# Patient Record
Sex: Female | Born: 1940 | Race: White | Hispanic: No | Marital: Married | Smoking: Former smoker
Health system: Southern US, Community
[De-identification: ages and names within clinical notes are randomized; demographics above are authoritative.]

## PROBLEM LIST (undated history)

## (undated) DIAGNOSIS — R21 Rash and other nonspecific skin eruption: Secondary | ICD-10-CM

## (undated) DIAGNOSIS — R32 Unspecified urinary incontinence: Secondary | ICD-10-CM

## (undated) DIAGNOSIS — Z98811 Dental restoration status: Secondary | ICD-10-CM

## (undated) DIAGNOSIS — K219 Gastro-esophageal reflux disease without esophagitis: Secondary | ICD-10-CM

## (undated) DIAGNOSIS — F32A Depression, unspecified: Secondary | ICD-10-CM

## (undated) DIAGNOSIS — Z8679 Personal history of other diseases of the circulatory system: Secondary | ICD-10-CM

## (undated) DIAGNOSIS — C50919 Malignant neoplasm of unspecified site of unspecified female breast: Secondary | ICD-10-CM

## (undated) DIAGNOSIS — F329 Major depressive disorder, single episode, unspecified: Secondary | ICD-10-CM

## (undated) DIAGNOSIS — K589 Irritable bowel syndrome without diarrhea: Secondary | ICD-10-CM

## (undated) DIAGNOSIS — M1990 Unspecified osteoarthritis, unspecified site: Secondary | ICD-10-CM

## (undated) DIAGNOSIS — J302 Other seasonal allergic rhinitis: Secondary | ICD-10-CM

## (undated) HISTORY — DX: Major depressive disorder, single episode, unspecified: F32.9

## (undated) HISTORY — DX: Malignant neoplasm of unspecified site of unspecified female breast: C50.919

## (undated) HISTORY — PX: TONSILLECTOMY: SUR1361

## (undated) HISTORY — PX: CARDIOVERSION: SHX1299

## (undated) HISTORY — PX: BLADDER SURGERY: SHX569

## (undated) HISTORY — DX: Unspecified osteoarthritis, unspecified site: M19.90

## (undated) HISTORY — PX: ABDOMINAL HYSTERECTOMY: SHX81

## (undated) HISTORY — DX: Irritable bowel syndrome, unspecified: K58.9

## (undated) HISTORY — DX: Depression, unspecified: F32.A

## (undated) HISTORY — PX: APPENDECTOMY: SHX54

## (undated) HISTORY — PX: TENNIS ELBOW RELEASE/NIRSCHEL PROCEDURE: SHX6651

## (undated) HISTORY — PX: CATARACT EXTRACTION W/ INTRAOCULAR LENS IMPLANT: SHX1309

---

## 2000-01-30 ENCOUNTER — Other Ambulatory Visit: Admission: RE | Admit: 2000-01-30 | Discharge: 2000-01-30 | Payer: Self-pay | Admitting: *Deleted

## 2000-04-20 ENCOUNTER — Ambulatory Visit (HOSPITAL_COMMUNITY): Admission: RE | Admit: 2000-04-20 | Discharge: 2000-04-20 | Payer: Self-pay | Admitting: Ophthalmology

## 2000-12-30 ENCOUNTER — Other Ambulatory Visit: Admission: RE | Admit: 2000-12-30 | Discharge: 2000-12-30 | Payer: Self-pay | Admitting: *Deleted

## 2002-11-09 ENCOUNTER — Encounter: Payer: Self-pay | Admitting: Orthopaedic Surgery

## 2002-11-09 ENCOUNTER — Encounter: Admission: RE | Admit: 2002-11-09 | Discharge: 2002-11-09 | Payer: Self-pay | Admitting: Orthopaedic Surgery

## 2002-12-21 ENCOUNTER — Encounter: Payer: Self-pay | Admitting: Neurological Surgery

## 2002-12-21 ENCOUNTER — Ambulatory Visit (HOSPITAL_COMMUNITY): Admission: RE | Admit: 2002-12-21 | Discharge: 2002-12-23 | Payer: Self-pay | Admitting: Neurological Surgery

## 2002-12-21 HISTORY — PX: LAMINECTOMY AND MICRODISCECTOMY LUMBAR SPINE: SHX1913

## 2003-02-02 ENCOUNTER — Other Ambulatory Visit: Admission: RE | Admit: 2003-02-02 | Discharge: 2003-02-02 | Payer: Self-pay | Admitting: Gynecology

## 2003-11-27 ENCOUNTER — Other Ambulatory Visit: Admission: RE | Admit: 2003-11-27 | Discharge: 2003-11-27 | Payer: Self-pay | Admitting: Gynecology

## 2003-12-07 ENCOUNTER — Ambulatory Visit (HOSPITAL_COMMUNITY): Admission: RE | Admit: 2003-12-07 | Discharge: 2003-12-07 | Payer: Self-pay | Admitting: Cardiology

## 2004-10-16 ENCOUNTER — Ambulatory Visit: Payer: Self-pay | Admitting: Internal Medicine

## 2004-12-01 ENCOUNTER — Other Ambulatory Visit: Admission: RE | Admit: 2004-12-01 | Discharge: 2004-12-01 | Payer: Self-pay | Admitting: Gynecology

## 2005-04-23 ENCOUNTER — Ambulatory Visit: Payer: Self-pay | Admitting: Internal Medicine

## 2005-09-23 ENCOUNTER — Ambulatory Visit: Payer: Self-pay | Admitting: Internal Medicine

## 2005-11-30 ENCOUNTER — Ambulatory Visit: Payer: Self-pay | Admitting: Internal Medicine

## 2005-12-16 ENCOUNTER — Other Ambulatory Visit: Admission: RE | Admit: 2005-12-16 | Discharge: 2005-12-16 | Payer: Self-pay | Admitting: Gynecology

## 2005-12-17 ENCOUNTER — Ambulatory Visit: Payer: Self-pay | Admitting: Internal Medicine

## 2006-06-11 ENCOUNTER — Ambulatory Visit: Payer: Self-pay | Admitting: Internal Medicine

## 2006-11-26 ENCOUNTER — Ambulatory Visit: Payer: Self-pay | Admitting: Internal Medicine

## 2006-12-30 ENCOUNTER — Ambulatory Visit: Payer: Self-pay | Admitting: Internal Medicine

## 2007-01-20 ENCOUNTER — Other Ambulatory Visit: Admission: RE | Admit: 2007-01-20 | Discharge: 2007-01-20 | Payer: Self-pay | Admitting: Gynecology

## 2007-07-07 ENCOUNTER — Ambulatory Visit: Payer: Self-pay | Admitting: Internal Medicine

## 2009-03-20 ENCOUNTER — Encounter: Admission: RE | Admit: 2009-03-20 | Discharge: 2009-03-20 | Payer: Self-pay | Admitting: Neurological Surgery

## 2009-12-18 ENCOUNTER — Encounter: Admission: RE | Admit: 2009-12-18 | Discharge: 2009-12-18 | Payer: Self-pay | Admitting: Neurological Surgery

## 2011-02-27 NOTE — Op Note (Signed)
NAME:  Kathryn Sanchez, Kathryn Sanchez                        ACCOUNT NO.:  [id number]   MEDICAL RECORD NO.:  [id number]                   PATIENT TYPE:  OIB   LOCATION:  3172                                 FACILITY:  [facility]   PHYSICIAN:  Stefani Dama, M.D.               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   PROCEDURE:  Right L4-5 laminotomy, foraminotomy, with microdissection  technique and operating microscope.   SURGEON:  Stefani Dama, M.D.   ASSISTANT:  Hilda Lias, M.D.   ANESTHESIA:  General endotracheal.   INDICATIONS:  The patient is a 70 year old individual who has had  significant back and right lower extremity pain with some weakness in the  tibialis anterior group.  She has a large synovial cyst on the right side at  the L4-5 level.  She is advised regarding surgical decompression of this  lesion.  She is taken to the operating room.   DESCRIPTION OF PROCEDURE:  The patient was brought to the operating room  supine on the stretcher.  After the smooth induction of general endotracheal  anesthesia, she was turned prone and the back was shaved, prepped with  Duraprep, and draped in a sterile fashion.  A small incision was made in the  midportion of the lumbar spine and carried down to the lumbar dorsal fascia.  A localizing radiograph identified this as the 4-5 space positively and then  a subperiosteal dissection was performed in this area to expose the inferior  margin of the lamina of L4 out to the mesial facet joint.  A self-retaining  retractor was placed in this wound.  Then with the operating microscope  being draped and brought into the field, laminotomy was created removing the  inferior margin of the lamina of L4  out to the mesial wall of the facet.  The yellow ligament was taken up, and underneath the yellow ligament there  was noted to be a significant cystic structure, which had a tough, fibrous  capsule that was adherent to the dura.  Care was taken to resect this in a  piecemeal fashion, and the laminotomy was extended cephalad to expose the  entire extent of the cyst itself.  As the resection proceeded, the scar  adherent to the dural tube was teased off and resected from the dura.  This  was done carefully so as not to perforate the dura itself.  The L5 nerve  root was decompressed in this fashion.  Ultimately the entirety of the cyst  was resected using a microdissection technique.  Dr. Jeral Fruit helped during  this portion of the operation, providing retraction and protection of the  dura as the resection  continued.  In the end the common dural tube was  completely free of any scar tissue and attachments, and the foramen of the  L4 nerve root was noted to be free and clear.  Hemostasis in the soft  tissues was obtained, and then the lumbar dorsal fascia was reapproximated  with #1 Vicryl in interrupted fashion, 2-0 Vicryl was used subcutaneously, 3-  0 Vicryl was used subcuticularly.  The patient tolerated the procedure well.                                               Stefani Dama, M.D.    [name]  D:  12/21/2002  T:  12/21/2002  Job:  [id number]

## 2011-02-27 NOTE — Discharge Summary (Signed)
   NAME:  Kathryn Sanchez, Kathryn Sanchez                        ACCOUNT NO.:  [id number]   MEDICAL RECORD NO.:  [id number]                   PATIENT TYPE:  OIB   LOCATION:  3030                                 FACILITY:  [facility]   PHYSICIAN:  Stefani Dama, M.D.               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   OPERATION:  L3-4 laminotomy, foraminotomy, with microdissection technique.  Decompression of L4 and L5 nerve roots.   HOSPITAL COURSE:  Kathryn Sanchez is a 69 year old individual who has had  significant and quite severe back and right lower extremity pain.  She has a  synovial cyst on the right side, and evaluation of this as an outpatient  indicated that she should have surgical decompression.  This was performed  on 12/21/02.  Postoperatively, the patient complained of significant quite  severe back pain and spasms.  She was given some anti-inflammatory in the  form of IV Toradol, and this seemed to resolve the situation; however, the  patient required an addition night's stay as her level of pain on the end of  the first postoperative day was such that she could not ambulate  independently.  At the time of discharge, she was given a prescription for  Celebrex 200 mg twice a day in addition to Percocet, #40 without refills,  Valium, #30 without refills.   CONDITION ON DISCHARGE:  Improving.                                               Stefani Dama, M.D.    [name]  D:  12/22/2002  T:  12/24/2002  Job:  [id number]

## 2011-02-27 NOTE — Assessment & Plan Note (Signed)
[facility]                             PULMONARY OFFICE NOTE   Kathryn Sanchez, Kathryn Sanchez                     MRN:          [id number]  DATE:11/26/2006                            DOB:          09/06/1941    PROBLEMS:  Allergic rhinitis.   HISTORY:  She returns for one year follow up feeling quite stable. She  would like a prescription for Zithromax to hold, used twice last year.  We discussed resistance and sensitization issues. She continues allergy  vaccine at 1-10 with no problems. Injections are given by her husband.  She lives at [facility]. We discussed environmental triggers.  We discussed allergy vaccine safety, including a discussion of issues  related to administration outside of a medical office, anaphylaxis, and  epinephrine. I gave a refill EpiPen prescription.   MEDICATIONS:  1. Paxil 15 mg.  2. BuSpar 15 mg.  3. Estrogen patch.  4. Allergy vaccine.  5. Wellbutrin 300 mg.  6. Lipitor 10 mg.  7. Vesicare.  8. Restasis.  9. P.r.n. use of Z-pack and EpiPen.  Drug intolerant of PENICILLIN, KEFLEX, DEMEROL, and SULFA.   OBJECTIVE:  Weight 147 pounds, blood pressure 114/70, pulse 72, room air  saturation 98%. Nose and chest are clear. She seems quite comfortable.  Pharynx is normal. No adenopathy. Heart sounds normal.   IMPRESSION:  Allergic rhinitis, well controlled.   PLAN:  1. Continue vaccine at 1-10.  2. Refill EpiPen with discussion as above.  3. Refillable Z-pack with discussion.     Clinton D. Maple Hudson, MD, Tonny Bollman, FACP  Electronically Signed    CDY/MedQ  DD: 11/26/2006  DT: 11/27/2006  Job #: [id number]   cc:   Duwayne Heck L. Mahaffey, M.D.

## 2015-04-12 DIAGNOSIS — C50919 Malignant neoplasm of unspecified site of unspecified female breast: Secondary | ICD-10-CM

## 2015-04-12 HISTORY — DX: Malignant neoplasm of unspecified site of unspecified female breast: C50.919

## 2015-04-26 ENCOUNTER — Telehealth: Payer: Self-pay | Admitting: *Deleted

## 2015-04-26 NOTE — Telephone Encounter (Signed)
Confirmed [facility] for 05/01/15 at 1230 .  Instructions and contact information given.

## 2015-04-30 ENCOUNTER — Other Ambulatory Visit: Payer: Self-pay | Admitting: *Deleted

## 2015-04-30 DIAGNOSIS — C50511 Malignant neoplasm of lower-outer quadrant of right female breast: Secondary | ICD-10-CM | POA: Insufficient documentation

## 2015-05-01 ENCOUNTER — Encounter: Payer: Self-pay | Admitting: *Deleted

## 2015-05-01 ENCOUNTER — Encounter: Payer: Self-pay | Admitting: Adult Health

## 2015-05-01 ENCOUNTER — Ambulatory Visit: Payer: Medicare Other

## 2015-05-01 ENCOUNTER — Other Ambulatory Visit: Payer: Self-pay | Admitting: General Surgery

## 2015-05-01 ENCOUNTER — Other Ambulatory Visit (HOSPITAL_BASED_OUTPATIENT_CLINIC_OR_DEPARTMENT_OTHER): Payer: Medicare Other

## 2015-05-01 ENCOUNTER — Encounter (INDEPENDENT_AMBULATORY_CARE_PROVIDER_SITE_OTHER): Payer: Self-pay

## 2015-05-01 ENCOUNTER — Ambulatory Visit: Payer: Medicare Other | Attending: General Surgery | Admitting: Physical Therapy

## 2015-05-01 ENCOUNTER — Ambulatory Visit (HOSPITAL_BASED_OUTPATIENT_CLINIC_OR_DEPARTMENT_OTHER): Payer: Medicare Other | Admitting: Hematology and Oncology

## 2015-05-01 ENCOUNTER — Encounter: Payer: Self-pay | Admitting: Hematology and Oncology

## 2015-05-01 ENCOUNTER — Ambulatory Visit
Admission: RE | Admit: 2015-05-01 | Discharge: 2015-05-01 | Disposition: A | Discharge status: Home / Self Care | Payer: Medicare Other | Source: Ambulatory Visit | Attending: Radiation Oncology | Admitting: Radiation Oncology

## 2015-05-01 VITALS — BP 143/62 | HR 84 | Temp 98.0°F | Resp 18 | Ht 62.0 in | Wt 145.7 lb

## 2015-05-01 DIAGNOSIS — R293 Abnormal posture: Secondary | ICD-10-CM | POA: Insufficient documentation

## 2015-05-01 DIAGNOSIS — C50511 Malignant neoplasm of lower-outer quadrant of right female breast: Secondary | ICD-10-CM

## 2015-05-01 DIAGNOSIS — Z170 Estrogen receptor positive status [ER+]: Secondary | ICD-10-CM

## 2015-05-01 LAB — COMPREHENSIVE METABOLIC PANEL (CC13)
ALT: 29 U/L (ref 0–55)
ANION GAP: 10 meq/L (ref 3–11)
AST: 31 U/L (ref 5–34)
Albumin: 3.9 g/dL (ref 3.5–5.0)
Alkaline Phosphatase: 95 U/L (ref 40–150)
BUN: 16.1 mg/dL (ref 7.0–26.0)
CO2: 25 mEq/L (ref 22–29)
Calcium: 9.4 mg/dL (ref 8.4–10.4)
Chloride: 105 mEq/L (ref 98–109)
Creatinine: 1 mg/dL (ref 0.6–1.1)
EGFR: 58 mL/min/{1.73_m2} — ABNORMAL LOW (ref >90)
Glucose: 126 mg/dl (ref 70–140)
Potassium: 3.9 mEq/L (ref 3.5–5.1)
SODIUM: 140 meq/L (ref 136–145)
Total Bilirubin: 0.45 mg/dL (ref 0.20–1.20)
Total Protein: 7.1 g/dL (ref 6.4–8.3)

## 2015-05-01 LAB — CBC WITH DIFFERENTIAL/PLATELET
BASO%: 1.1 % (ref 0.0–2.0)
BASOS ABS: 0.1 10*3/uL (ref 0.0–0.1)
EOS%: 1.6 % (ref 0.0–7.0)
Eosinophils Absolute: 0.1 10*3/uL (ref 0.0–0.5)
HEMATOCRIT: 42.3 % (ref 34.8–46.6)
HGB: 13.9 g/dL (ref 11.6–15.9)
LYMPH%: 33.8 % (ref 14.0–49.7)
MCH: 32 pg (ref 25.1–34.0)
MCHC: 32.9 g/dL (ref 31.5–36.0)
MCV: 97.5 fL (ref 79.5–101.0)
MONO#: 0.5 10*3/uL (ref 0.1–0.9)
MONO%: 8.2 % (ref 0.0–14.0)
NEUT%: 55.3 % (ref 38.4–76.8)
NEUTROS ABS: 3.1 10*3/uL (ref 1.5–6.5)
PLATELETS: 254 10*3/uL (ref 145–400)
RBC: 4.34 10*6/uL (ref 3.70–5.45)
RDW: 14.2 % (ref 11.2–14.5)
WBC: 5.6 10*3/uL (ref 3.9–10.3)
lymph#: 1.9 10*3/uL (ref 0.9–3.3)

## 2015-05-01 NOTE — Progress Notes (Signed)
[place] NOTE  Patient Care Team: Pcp Not In System as PCP - Staunton III, MD as Consulting Physician (General Surgery) Nicholas Lose, MD as Consulting Physician (Hematology and Oncology) Gery Pray, MD as Consulting Physician (Radiation Oncology) Mauro Kaufmann, RN as Registered Nurse Rockwell Germany, RN as Registered Nurse Holley Bouche, NP as Nurse Practitioner (Nurse Practitioner)  CHIEF COMPLAINTS/PURPOSE OF CONSULTATION:  Newly diagnosed breast cancer  HISTORY OF PRESENTING ILLNESS:  Kathryn Sanchez 74 y.o. female is here because of recent diagnosis of right breast cancer. She had a routine screening mammogram detected a right breast mass that measures 7 mm at 6:00 position. This was further evaluated by ultrasound and ultrasound-guided biopsy. 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 Axilla was normal by ultrasound. She was presented this morning at the multidisciplinary tumor board and she is here. The [facility] clinic discussed the treatment plan.  I reviewed her records extensively and collaborated the history with the patient.  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  She was never exposed to fertility medications or  hormone replacement therapy.  She has no family history of Breast/GYN/GI cancer  MEDICAL HISTORY:  Past Medical History  Diagnosis Date  . Breast cancer   . Depression   . Atrial fibrillation   . IBS (irritable bowel syndrome)   . Incontinence   . Arthritis     SURGICAL HISTORY: Past Surgical History  Procedure Laterality Date  . Tonsillectomy    . Tennis elbow release/nirschel procedure    . Abdominal hysterectomy    . Appendectomy      SOCIAL HISTORY: History   Social History  . Marital Status: Married    Spouse Name: N/A  . Number of Children: N/A  . Years of Education: N/A   Occupational History  . Not on file.   Social History Main Topics  . Smoking status: Former Research scientist (life sciences)  . Smokeless tobacco: Not on file  . Alcohol Use: Yes  . Drug Use: No  . Sexual Activity: Not on file   Other Topics Concern  . Not on file   Social History Narrative  . No narrative on file    FAMILY HISTORY: History reviewed. No pertinent family history.  ALLERGIES:  is allergic to cephalexin; clarithromycin; flexeril; morphine; sulfadiazine; ciprofloxacin; metronidazole; and penicillins.  MEDICATIONS:  Current Outpatient Prescriptions  Medication Sig Dispense Refill  . buPROPion (WELLBUTRIN XL) 300 MG 24 hr tablet 1 po  daily    . busPIRone (BUSPAR) 15 MG tablet 1 by mouth daily    . diphenhydrAMINE (SOMINEX) 25 MG tablet Take 25 mg by mouth every 6 (six) hours as needed for allergies.    Marland Kitchen estradiol (CLIMARA - DOSED IN  MG/24 HR) 0.1 mg/24hr patch     . omeprazole (PRILOSEC) 20 MG capsule Take by mouth.    Marland Kitchen PARoxetine (PAXIL) 10 MG tablet 1 by mouth daily    . solifenacin (VESICARE) 10 MG tablet     . traMADol (ULTRAM) 50 MG tablet     . triamcinolone cream (KENALOG) 0.5 % Apply 1 application topically 2 (two) times daily.    . Vitamin D, Ergocalciferol, (DRISDOL) 50000 UNITS CAPS capsule Take 50,000 Units by mouth every 7 (seven) days.    Marland Kitchen warfarin (COUMADIN) 5 MG tablet       No current facility-administered medications for this visit.    REVIEW OF SYSTEMS:   Constitutional: Denies fevers, chills or abnormal night sweats Eyes: Denies blurriness of vision, double vision or watery eyes Ears, nose, mouth, throat, and face: Denies mucositis or sore throat Respiratory: Denies cough, dyspnea or wheezes Cardiovascular: Denies palpitation, chest discomfort or lower extremity swelling Gastrointestinal:  Denies nausea, heartburn or change in bowel habits Skin: Denies abnormal skin rashes Lymphatics: Denies new lymphadenopathy or easy bruising Neurological:Denies numbness, tingling or new weaknesses Behavioral/Psych: Mood is stable, no new changes  Breast:  Denies any palpable lumps or discharge All other systems were reviewed with the patient and are negative.  PHYSICAL EXAMINATION: ECOG PERFORMANCE STATUS: 1 - Symptomatic but completely ambulatory  Filed Vitals:   05/01/15 1302  BP: 143/62  Pulse: 84  Temp: 98 F (36.7 C)  Resp: 18   Filed Weights   05/01/15 1302  Weight: 145 lb 11.2 oz (66.089 kg)    GENERAL:alert, no distress and comfortable SKIN: skin color, texture, turgor are normal, no rashes or significant lesions EYES: normal, conjunctiva are pink and non-injected, sclera clear OROPHARYNX:no exudate, no erythema and lips, buccal mucosa, and tongue normal  NECK: supple, thyroid normal size, non-tender, without nodularity LYMPH:  no palpable lymphadenopathy in the cervical, axillary or inguinal LUNGS: clear to auscultation and percussion with normal breathing effort HEART: regular rate & rhythm and no murmurs and no lower extremity edema ABDOMEN:abdomen soft, non-tender and normal bowel sounds Musculoskeletal:no cyanosis of digits and no clubbing  PSYCH: alert & oriented x 3 with fluent speech NEURO: no focal motor/sensory deficits BREAST: No palpable nodules in breast. Bruising from recent biopsy. No palpable axillary or supraclavicular  lymphadenopathy (exam performed in the presence of a chaperone)   LABORATORY DATA:  I have reviewed the data as listed Lab Results  Component Value Date   WBC 5.6 05/01/2015   HGB 13.9 05/01/2015   HCT 42.3 05/01/2015   MCV 97.5 05/01/2015   PLT 254 05/01/2015   Lab Results  Component Value Date   NA 140 05/01/2015   K 3.9 05/01/2015   CO2 25 05/01/2015    RADIOGRAPHIC STUDIES: I have personally reviewed the radiological reports and agreed with the findings in the report. 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 7 mm by ultrasound T1b N0 M0 stage IA clinical stage  Pathology and radiology counseling: Discussed with the patient, the details of pathology including the type of breast cancer,the clinical staging, the significance of ER, PR and HER-2/neu receptors and the implications for treatment. After reviewing the pathology in detail, we proceeded to discuss the different treatment options between surgery, radiation, antiestrogen  therapies.  Recommendation: 1. Breast conserving surgery followed by 2. Adjuvant radiation followed by 3. Antiestrogen therapy with anastrozole 1 mg daily 5 years bone density test done on 04/24/2015 showed T score of -1 normal bone density  Aromatase number or counseling:We discussed the risks and benefits of anti-estrogen therapy with aromatase inhibitors. These include but not limited to insomnia, hot flashes, mood changes, vaginal dryness, bone density loss, and weight gain. Although rare, serious side effects including endometrial cancer, risk of blood clots were also discussed. We strongly believe that the benefits far outweigh the risks. Patient understands these risks and consented to  starting treatment. Planned treatment duration is 5 years.  Return to clinic after surgery to discuss adjuvant treatment plan.    All questions were answered. The patient knows to call the clinic with any problems, questions or concerns.    Rulon Eisenmenger, MD 3:21 PM

## 2015-05-01 NOTE — Progress Notes (Signed)
Note created during office visit. Copy to patient, original to scan.

## 2015-05-01 NOTE — Progress Notes (Signed)
[place] Psychosocial Distress Screening Clinical Social Work  Clinical Social Work was referred by Engineer, site to meet with pt, as pt checked issues with depression, hallucinations and suicidal thoughts. CSW also met with pt to introduce self, explain role of Pt and Family Support Team and review distress screening protocol.  The patient scored a 3 on the Psychosocial Distress Thermometer which indicates no distress. Clinical Social Worker met with pt and her husband to assess for distress and other psychosocial needs. Pt reports she had h/o depression and SI that occurred in 1996. She denies current concerns with these issues and has been well managed on her medications. CSW inquired about current coping with new cancer diagnosis and she reports to be doing pretty well. She is eager to get her surgery complete and "get things sorted out". She feels well support by her husband who is with her today, he was also aware of her mental health needs. She feels her grand children cause her the most stress, as they are in college and "making some poor choices". Pt is aware of a local breast cancer support group in her local area and she plans to attend. CSW reviewed other options for support through the [facility] and Pt and Family Support Team. CSW encouraged pt to reach out and notify team/medical provider if she starts feeling more depressed or has SI. She stated understanding.   ONCBCN DISTRESS SCREENING 05/01/2015  Screening Type Initial Screening  Distress experienced in past week (1-10) 3  Family Problem type Other (comment);Children  Physical Problem type Constipation/diarrhea;Other (comment)  Physician notified of physical symptoms Yes  Referral to clinical social work Yes     Clinical Social Worker follow up needed: No.  If yes, follow up plan: See above Loren Racer, [place]  [facility] Phone: [phone] Fax: [phone]

## 2015-05-01 NOTE — Progress Notes (Signed)
Received bone density report from [facility], sent to scan. 

## 2015-05-01 NOTE — Progress Notes (Signed)
Radiation Oncology         [phone] ________________________________  Initial Outpatient Consultation  Name: Kathryn Sanchez MRN: [id number]  Date: 05/01/2015  DOB: 1941/06/04  CC:Pcp Not In [place], [place], MD   REFERRING PHYSICIAN: Autumn Messing III, MD  DIAGNOSIS: The encounter diagnosis was Breast cancer of lower-outer quadrant of right female breast. Grade I to II Invasive Ductal Carcinoma   HISTORY OF PRESENT ILLNESS::Kathryn Sanchez is a 74 y.o. female who is here at the courtesy of Dr. Marlou Starks because of recent diagnosis of right breast cancer.  She had a routine screening mammogram detected a right breast mass that measures 7 mm at 6:00 position. This was further evaluated by ultrasound and ultrasound-guided biopsy. 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 Axilla was normal by ultrasound. She was presented this morning at the multidisciplinary tumor board and she is here. The [facility] clinic discussed the treatment plan.  She is interested in breast conservation therapy.  PREVIOUS RADIATION THERAPY: No  PAST MEDICAL HISTORY:  has a past medical history of Breast cancer; Depression; Atrial fibrillation; IBS (irritable bowel syndrome); Incontinence; and Arthritis.    PAST SURGICAL HISTORY: Past Surgical History  Procedure Laterality Date  . Tonsillectomy    . Tennis elbow release/nirschel procedure    . Abdominal hysterectomy    . Appendectomy      FAMILY HISTORY: family history is not on file.  SOCIAL HISTORY:  reports that she has quit smoking. She does not have any smokeless tobacco history on file. She reports that she drinks alcohol. She reports that she does not use illicit drugs.  ALLERGIES: Cephalexin; Clarithromycin; Flexeril; Morphine; Sulfadiazine; Ciprofloxacin; Metronidazole; and Penicillins  MEDICATIONS:  Current Outpatient Prescriptions  Medication Sig Dispense Refill  .  buPROPion (WELLBUTRIN XL) 300 MG 24 hr tablet 1 po  daily    . busPIRone (BUSPAR) 15 MG tablet 1 by mouth daily    . diphenhydrAMINE (SOMINEX) 25 MG tablet Take 25 mg by mouth every 6 (six) hours as needed for allergies.    Marland Kitchen estradiol (CLIMARA - DOSED IN MG/24 HR) 0.1 mg/24hr patch     . omeprazole (PRILOSEC) 20 MG capsule Take by mouth.    Marland Kitchen PARoxetine (PAXIL) 10 MG tablet 1 by mouth daily    . solifenacin (VESICARE) 10 MG tablet     . traMADol (ULTRAM) 50 MG tablet     . triamcinolone cream (KENALOG) 0.5 % Apply 1 application topically 2 (two) times daily.    . Vitamin D, Ergocalciferol, (DRISDOL) 50000 UNITS CAPS capsule Take 50,000 Units by mouth every 7 (seven) days.    Marland Kitchen warfarin (COUMADIN) 5 MG tablet      No current facility-administered medications for this encounter.    REVIEW OF SYSTEMS:  A 15 point review of systems is documented in the electronic medical record. This was obtained by the nursing staff. However, I reviewed this with the patient to discuss relevant findings and make appropriate changes.  Breast: Denies any palpable lumps or discharge   Gynecologic History  Age at first menstrual period? 13  Are you still having periods? No Approximate date of last period? 1987  If you no longer have periods: Have you used hormone replacement? Yes  If YES, for how long? Still using Obstetric History:  How many children have you carried to term? 2 Your age at first live birth? [place]  Pregnant now or trying to get pregnant? No  Have you used birth control pills or hormone shots for contraception? Yes  If so, for how long (or approximate dates)? Pills for 10 years. IUD for 2 years.  Would you be interested in learning more about he options to preserve fertility? No\  Health Maintenance:  Have you ever had a colonoscopy? Yes (Several). Refer to Dr. Teena Irani.  Have you ever had a bone density? Yes If yes, date? Last week.  Date of your last PAP smear? 2015 Date of your FIRST  mammogram? 74 years old   PHYSICAL EXAM:  Vitals - 1 value per visit 8/40/3754  SYSTOLIC 360  DIASTOLIC 62  Pulse 84  Temperature 98  Respirations 18  Weight (lb) 145.7  Height 5' 2"   BMI 26.64  VISIT REPORT    Lungs are clear. Heart has regular rate and rhythm. No palpable cervical, supraclavicular, or axillary adenopathy. No palpable mass or nipple discharge in the left breast. The right breast has no palpable mass or nipple discharge on examination. Bruising in the inferior aspect of the breast from  recent biopsy is noted.  ECOG = 1  LABORATORY DATA:  Lab Results  Component Value Date   WBC 5.6 05/01/2015   HGB 13.9 05/01/2015   HCT 42.3 05/01/2015   MCV 97.5 05/01/2015   PLT 254 05/01/2015   NEUTROABS 3.1 05/01/2015   Lab Results  Component Value Date   NA 140 05/01/2015   K 3.9 05/01/2015   CO2 25 05/01/2015   GLUCOSE 126 05/01/2015   CREATININE 1.0 05/01/2015   CALCIUM 9.4 05/01/2015      RADIOGRAPHY: results reviewed from  [facility] breast [facility]  Right breast irregular mass with calcifications extend 5 mm anterior to the mass, mass itself is 7 mm at 6:00 position, axilla normal  IMPRESSION: 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 Good candidate for breast conservation therapy.  PLAN:   1. Breast conserving surgery   2. Adjuvant radiation - she may choose to have in [place] closer to her home. She lives at [facility] in [place].  3. Antiestrogen therapy with anastrozole 1 mg daily 5 years    She met with surgery, medical oncology as well as a member of our patient family support team, a dietitian, our survivor navigator, and our physical therapist.    This document serves as a record of services personally performed by Gery Pray, MD. It was created on his behalf by Darcus Austin, a trained medical scribe. The creation of this record is based on the scribe's personal observations and the provider's statements to them. This  document has been checked and approved by the attending provider.   ------------------------------------------------  Blair Promise, PhD, MD

## 2015-05-01 NOTE — Progress Notes (Signed)
Kathryn Sanchez is a very pleasant 74 y.o. 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  Biopsy results revealed the tumor's prognostic profile is ER positive, PR positive, and HER2/neu negative. Ki67 is 13%.  She presents today with her husband to the [facility]) for treatment consideration and recommendations from the breast surgeon, radiation oncologist, and medical oncologist.     I briefly met with Kathryn Sanchez and her husband during her [facility] visit today. We discussed the purpose of the Survivorship Clinic, which will include monitoring for recurrence, coordinating completion of age and gender-appropriate cancer screenings, promotion of overall wellness, as well as managing potential late/long-term side effects of anti-cancer treatments.    The treatment plan for Kathryn Sanchez will likely include surgery, +/- radiation therapy, and anti-estrogen therapy.  As of today, the intent of treatment for Kathryn Sanchez is cure, therefore she will be eligible for the Survivorship Clinic upon her completion of treatment.  Her survivorship care plan (SCP) document will be drafted and updated throughout the course of her treatment trajectory. She will receive the SCP in an office visit with myself in the Survivorship Clinic once she has completed treatment.   Kathryn Sanchez was encouraged to ask questions and all questions were answered to her satisfaction.  She was given my business card and encouraged to contact me with any concerns regarding survivorship.  I look forward to participating in her care.   Mike Craze, NP [place] [phone]

## 2015-05-01 NOTE — Progress Notes (Signed)
Checked in new pt with no financial concerns prior to seeing the dr.  Pt has 2 insurances so financial assistance may not be needed but she has my card for any billing questions or concerns. °

## 2015-05-01 NOTE — Assessment & Plan Note (Signed)
Right breast biopsy 04/24/2015 6:00: 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, ER/PR positive HER-2 negative Ki-67 13%; with associated microcalcifications with an area of benign adenosis. 7 mm by ultrasound T1b N0 M0 stage IA clinical stage  Pathology and radiology counseling: Discussed with the patient, the details of pathology including the type of breast cancer,the clinical staging, the significance of ER, PR and HER-2/neu receptors and the implications for treatment. After reviewing the pathology in detail, we proceeded to discuss the different treatment options between surgery, radiation, antiestrogen therapies.  Recommendation: 1. Breast conserving surgery followed by 2. Adjuvant radiation followed by 3. Antiestrogen therapy with anastrozole 1 mg daily 5 years bone density test done on 04/24/2015 showed T score of -1 normal bone density  Aromatase number or counseling:We discussed the risks and benefits of anti-estrogen therapy with aromatase inhibitors. These include but not limited to insomnia, hot flashes, mood changes, vaginal dryness, bone density loss, and weight gain. Although rare, serious side effects including endometrial cancer, risk of blood clots were also discussed. We strongly believe that the benefits far outweigh the risks. Patient understands these risks and consented to starting treatment. Planned treatment duration is 5 years.  Return to clinic after surgery to discuss adjuvant treatment plan.

## 2015-05-01 NOTE — Therapy (Signed)
[place], [place], [address] Phone: [phone]   Fax:  [phone]  Physical Therapy Evaluation  Patient Details  Name: Kathryn Sanchez MRN: [id number] Date of Birth: [birth year] Referring Provider:  Jovita Kussmaul, MD  Encounter Date: 05/01/2015      PT End of Session - 05/01/15 1550    Visit Number 1   Number of Visits 1   PT Start Time 2505   PT Stop Time 1513   PT Time Calculation (min) 28 min   Activity Tolerance Patient tolerated treatment well   Behavior During Therapy [facility] for tasks assessed/performed      Past Medical History  Diagnosis Date  . Breast cancer   . Depression   . Atrial fibrillation   . IBS (irritable bowel syndrome)   . Incontinence   . Arthritis     Past Surgical History  Procedure Laterality Date  . Tonsillectomy    . Tennis elbow release/nirschel procedure    . Abdominal hysterectomy    . Appendectomy      There were no vitals filed for this visit.  Visit Diagnosis:  Carcinoma of lower outer quadrant of right breast - Plan: PT plan of care cert/re-cert  Abnormal posture - Plan: PT plan of care cert/re-cert      Subjective Assessment - 05/01/15 1528    Subjective Patient was seen today for a baseline assessment of her newly diagnosed right breast cancer.   Patient is accompained by: Family member   Pertinent History Diagnosed 04/17/15 with right grade 1 invasive ductal carcinoma in her breast.  It is located in the right lower outer quadrant and measures 7 mm on ultrasound.  It is ER/PR positive, HER2 negative with a Ki67 of 13%.   Patient Stated Goals Reduce lymphedema risk and learn post op shoulder ROM HEP   Currently in Pain? No/denies            [facility] PT Assessment - 05/01/15 0001    Assessment   Medical Diagnosis Right breast cancer   Onset Date/Surgical Date 04/17/15   Hand Dominance Right   Prior Therapy none   Precautions   Precautions  Other (comment)  Active breast cancer   Restrictions   Weight Bearing Restrictions No   Balance Screen   Has the patient fallen in the past 6 months Yes   How many times? 1  She fell 3/16 and broke her toes and teeth.    Has the patient had a decrease in activity level because of a fear of falling?  No  She agrees to do PT after surgery but lives in [place]    Is the patient reluctant to leave their home because of a fear of falling?  No   Home Ecologist residence   Living Arrangements Spouse/significant other   Available Help at Discharge Family   Prior Function   Level of [place] Retired   Biomedical scientist Retired Pharmacist, hospital   Leisure She does not exercise   Cognition   Overall Cognitive Status Within Functional Limits for tasks assessed   Posture/Postural Control   Posture/Postural Control Postural limitations   Postural Limitations Rounded Shoulders;Forward head   ROM / Strength   AROM / PROM / Strength AROM;Strength   AROM   AROM Assessment Site Shoulder   Right/Left Shoulder Left;Right   Right Shoulder Extension 55 Degrees   Right Shoulder Flexion 141 Degrees   Right  Shoulder ABduction 145 Degrees   Right Shoulder Internal Rotation 70 Degrees   Right Shoulder External Rotation 71 Degrees   Left Shoulder Extension 55 Degrees   Left Shoulder Flexion 143 Degrees   Left Shoulder ABduction 150 Degrees   Left Shoulder Internal Rotation 70 Degrees   Left Shoulder External Rotation 82 Degrees   Strength   Overall Strength Within functional limits for tasks performed           LYMPHEDEMA/ONCOLOGY QUESTIONNAIRE - 05/01/15 1546    Type   Cancer Type Right breast cancer   Lymphedema Assessments   Lymphedema Assessments Upper extremities   Right Upper Extremity Lymphedema   10 cm Proximal to Olecranon Process 25.1 cm   Olecranon Process 21.5 cm   10 cm Proximal to Ulnar Styloid Process 19.2 cm   Just  Proximal to Ulnar Styloid Process 14 cm   Across Hand at [employer] 17.7 cm   At [place] of 2nd Digit 6 cm   Left Upper Extremity Lymphedema   10 cm Proximal to Olecranon Process 24.9 cm   Olecranon Process 21.4 cm   10 cm Proximal to Ulnar Styloid Process 19 cm   Just Proximal to Ulnar Styloid Process 14.1 cm   Across Hand at [employer] 17.8 cm   At [place] of 2nd Digit 5.7 cm      Patient was instructed today in a home exercise program today for post op shoulder range of motion. These included active assist shoulder flexion in sitting, scapular retraction, wall walking with shoulder abduction, and hands behind head external rotation.  She was encouraged to do these twice a day, holding 3 seconds and repeating 5 times when permitted by her physician.         PT Education - 05/01/15 1549    Education provided Yes   Education Details Post op shoulder ROM HEP and lymphedema risk reduction   Person(s) Educated Patient;Spouse   Methods Explanation;Demonstration;Handout   Comprehension Verbalized understanding;Returned demonstration              Breast Clinic Goals - 05/01/15 1555    Patient will be able to verbalize understanding of pertinent lymphedema risk reduction practices relevant to her diagnosis specifically related to skin care.   Time 1   Period Days   Status Achieved   Patient will be able to return demonstrate and/or verbalize understanding of the post-op home exercise program related to regaining shoulder range of motion.   Time 1   Period Days   Status Achieved   Patient will be able to verbalize understanding of the importance of attending the postoperative After Breast Cancer Class for further lymphedema risk reduction education and therapeutic exercise.   Time 1   Period Days   Status Achieved              Plan - 05/01/15 1550    Clinical Impression Statement Diagnosed 04/17/15 with right grade 1 invasive ductal carcinoma in her breast.  It is  located in the right lower outer quadrant and measures 7 mm on ultrasound.  It is ER/PR positive, HER2 negative with a Ki67 of 13%.  She is planning to have a right lumpectomy with a sentinel node biopsy followed by Oncotype testing and possible radiation and possible anti-estrogen therapy.  She will benefit from post op PT to regain shoulder ROM and strength and reduce lymphedema risk.   Pt will benefit from skilled therapeutic intervention in order to improve on the following  deficits Decreased range of motion;Decreased strength;Decreased knowledge of precautions;Pain;Impaired UE functional use   Rehab Potential Excellent   Clinical Impairments Affecting Rehab Potential Patient lives in [place] 2 hours away so if she needs follow up PT after surgery, she may choose to go somewhere closer to home.   PT Frequency One time visit   PT Treatment/Interventions Patient/family education;Therapeutic exercise   Consulted and Agree with Plan of Care Patient;Family member/caregiver   Family Member Consulted Husband     Patient will follow up at outpatient cancer rehab if needed following surgery.  If the patient requires physical therapy at that time, a specific plan will be dictated and sent to the referring physician for approval. The patient was educated today on appropriate basic range of motion exercises to begin post operatively and the importance of attending the After Breast Cancer class following surgery.  Patient was educated today on lymphedema risk reduction practices as it pertains to recommendations that will benefit the patient immediately following surgery.  She verbalized good understanding.  No additional physical therapy is indicated at this time.         G-Codes - 05/10/2015 1555    Functional Assessment Tool Used Clinical Judgement   Functional Limitation Self care   Self Care Current Status [phone]) At least 1 percent but less than 20 percent impaired, limited or restricted   Self Care  Goal Status ([id number]) At least 1 percent but less than 20 percent impaired, limited or restricted   Self Care Discharge Status [phone]) At least 1 percent but less than 20 percent impaired, limited or restricted       Problem List Patient Active Problem List   Diagnosis Date Noted  . Breast cancer of lower-outer quadrant of right female breast 04/30/2015    Annia Friendly, PT 05/10/2015, 3:57 PM  [place], [place], [address] Phone: [phone]   Fax:  [phone]

## 2015-05-01 NOTE — Patient Instructions (Signed)

## 2015-05-06 ENCOUNTER — Telehealth: Payer: Self-pay | Admitting: *Deleted

## 2015-05-06 ENCOUNTER — Other Ambulatory Visit: Payer: Self-pay

## 2015-05-06 NOTE — Telephone Encounter (Signed)
Spoke to pt concerning [place] from 05/01/15. Denies questions or concerns regarding dx or treatment care plan. Encourage pt to call with needs. Received verbal understanding. Contact information given.

## 2015-05-06 NOTE — Telephone Encounter (Signed)
Left vm for pt to return call regarding [place] from 05/01/15. Contact information given.

## 2015-05-16 ENCOUNTER — Encounter (HOSPITAL_BASED_OUTPATIENT_CLINIC_OR_DEPARTMENT_OTHER): Payer: Self-pay | Admitting: *Deleted

## 2015-05-16 DIAGNOSIS — R21 Rash and other nonspecific skin eruption: Secondary | ICD-10-CM

## 2015-05-16 HISTORY — DX: Rash and other nonspecific skin eruption: R21

## 2015-05-17 ENCOUNTER — Encounter: Payer: Self-pay | Admitting: *Deleted

## 2015-05-17 ENCOUNTER — Telehealth: Payer: Self-pay | Admitting: Hematology and Oncology

## 2015-05-17 NOTE — Telephone Encounter (Signed)
lvm for pt regarding to Aug appt... °

## 2015-05-17 NOTE — Pre-Procedure Instructions (Signed)
Cardiac clearance reviewed by Dr. Al Corpus; pt. OK to come for surgery.

## 2015-05-20 ENCOUNTER — Encounter (HOSPITAL_BASED_OUTPATIENT_CLINIC_OR_DEPARTMENT_OTHER)
Admission: RE | Admit: 2015-05-20 | Discharge: 2015-05-20 | Disposition: A | Discharge status: Home / Self Care | Payer: Medicare Other | Source: Ambulatory Visit | Attending: General Surgery | Admitting: General Surgery

## 2015-05-20 DIAGNOSIS — C50911 Malignant neoplasm of unspecified site of right female breast: Secondary | ICD-10-CM | POA: Diagnosis not present

## 2015-05-20 LAB — PROTIME-INR
INR: 1.31 (ref 0.00–1.49)
Prothrombin Time: 16.4 seconds — ABNORMAL HIGH (ref 11.6–15.2)

## 2015-05-20 NOTE — Progress Notes (Signed)
Called office with high pt, office will attempt to contact Dr Marlou Starks which is off but will try to contact

## 2015-05-21 ENCOUNTER — Ambulatory Visit (HOSPITAL_BASED_OUTPATIENT_CLINIC_OR_DEPARTMENT_OTHER): Payer: Medicare Other | Admitting: Anesthesiology

## 2015-05-21 ENCOUNTER — Encounter (HOSPITAL_BASED_OUTPATIENT_CLINIC_OR_DEPARTMENT_OTHER)
Admission: RE | Disposition: A | Discharge status: Home / Self Care | Payer: Self-pay | Source: Ambulatory Visit | Attending: General Surgery

## 2015-05-21 ENCOUNTER — Ambulatory Visit (HOSPITAL_COMMUNITY)
Admission: RE | Admit: 2015-05-21 | Discharge: 2015-05-21 | Disposition: A | Discharge status: Home / Self Care | Payer: Medicare Other | Source: Ambulatory Visit | Attending: General Surgery | Admitting: General Surgery

## 2015-05-21 ENCOUNTER — Encounter (HOSPITAL_BASED_OUTPATIENT_CLINIC_OR_DEPARTMENT_OTHER): Payer: Self-pay

## 2015-05-21 DIAGNOSIS — C50911 Malignant neoplasm of unspecified site of right female breast: Secondary | ICD-10-CM | POA: Diagnosis not present

## 2015-05-21 DIAGNOSIS — C50511 Malignant neoplasm of lower-outer quadrant of right female breast: Secondary | ICD-10-CM

## 2015-05-21 HISTORY — DX: Personal history of other diseases of the circulatory system: Z86.79

## 2015-05-21 HISTORY — DX: Other seasonal allergic rhinitis: J30.2

## 2015-05-21 HISTORY — DX: Rash and other nonspecific skin eruption: R21

## 2015-05-21 HISTORY — PX: RADIOACTIVE SEED GUIDED PARTIAL MASTECTOMY WITH AXILLARY SENTINEL LYMPH NODE BIOPSY: SHX6520

## 2015-05-21 HISTORY — DX: Dental restoration status: Z98.811

## 2015-05-21 HISTORY — DX: Gastro-esophageal reflux disease without esophagitis: K21.9

## 2015-05-21 HISTORY — DX: Unspecified urinary incontinence: R32

## 2015-05-21 SURGERY — RADIOACTIVE SEED GUIDED PARTIAL MASTECTOMY WITH AXILLARY SENTINEL LYMPH NODE BIOPSY
Anesthesia: Regional | Site: Breast | Laterality: Right

## 2015-05-21 MED ORDER — GLYCOPYRROLATE 0.2 MG/ML IJ SOLN
INTRAMUSCULAR | Status: DC | PRN
  Administered 2015-05-21: 0.2 mg via INTRAVENOUS

## 2015-05-21 MED ORDER — BUPIVACAINE HCL (PF) 0.25 % IJ SOLN
INTRAMUSCULAR | Status: AC
  Filled 2015-05-21: qty 30

## 2015-05-21 MED ORDER — CHLORHEXIDINE GLUCONATE 4 % EX LIQD: 1.0000 "application " | Freq: Once | CUTANEOUS | Status: DC

## 2015-05-21 MED ORDER — OXYCODONE-ACETAMINOPHEN 5-325 MG PO TABS
1.0000 | ORAL_TABLET | Freq: Once | ORAL | Status: AC
  Administered 2015-05-21: 1 via ORAL

## 2015-05-21 MED ORDER — LACTATED RINGERS IV SOLN
INTRAVENOUS | Status: DC
  Administered 2015-05-21 (×2): via INTRAVENOUS

## 2015-05-21 MED ORDER — VANCOMYCIN HCL IN DEXTROSE 1-5 GM/200ML-% IV SOLN
INTRAVENOUS | Status: AC
  Filled 2015-05-21: qty 200

## 2015-05-21 MED ORDER — PROPOFOL 10 MG/ML IV BOLUS
INTRAVENOUS | Status: DC | PRN
  Administered 2015-05-21 (×2): 50 mg via INTRAVENOUS
  Administered 2015-05-21: 150 mg via INTRAVENOUS

## 2015-05-21 MED ORDER — LIDOCAINE HCL (CARDIAC) 20 MG/ML IV SOLN
INTRAVENOUS | Status: DC | PRN
  Administered 2015-05-21 (×2): 50 mg via INTRAVENOUS

## 2015-05-21 MED ORDER — BUPIVACAINE HCL (PF) 0.25 % IJ SOLN
INTRAMUSCULAR | Status: DC | PRN
Start: 2015-05-21 — End: 2015-05-21
  Administered 2015-05-21: 8 mL

## 2015-05-21 MED ORDER — MIDAZOLAM HCL 2 MG/2ML IJ SOLN
INTRAMUSCULAR | Status: AC
  Filled 2015-05-21: qty 2

## 2015-05-21 MED ORDER — ONDANSETRON HCL 4 MG/2ML IJ SOLN
INTRAMUSCULAR | Status: DC | PRN
  Administered 2015-05-21: 4 mg via INTRAVENOUS

## 2015-05-21 MED ORDER — SCOPOLAMINE 1 MG/3DAYS TD PT72: 1.0000 | MEDICATED_PATCH | Freq: Once | TRANSDERMAL | Status: DC | PRN

## 2015-05-21 MED ORDER — TECHNETIUM TC 99M SULFUR COLLOID FILTERED
1.0000 | Freq: Once | INTRAVENOUS | Status: AC | PRN
  Administered 2015-05-21: 1 via INTRADERMAL

## 2015-05-21 MED ORDER — HYDROMORPHONE HCL 1 MG/ML IJ SOLN
0.2500 mg | INTRAMUSCULAR | Status: DC | PRN
  Administered 2015-05-21: 0.5 mg via INTRAVENOUS

## 2015-05-21 MED ORDER — HYDROMORPHONE HCL 1 MG/ML IJ SOLN
INTRAMUSCULAR | Status: AC
  Filled 2015-05-21: qty 1

## 2015-05-21 MED ORDER — FENTANYL CITRATE (PF) 100 MCG/2ML IJ SOLN
INTRAMUSCULAR | Status: AC
  Filled 2015-05-21: qty 6

## 2015-05-21 MED ORDER — DEXAMETHASONE SODIUM PHOSPHATE 4 MG/ML IJ SOLN
INTRAMUSCULAR | Status: DC | PRN
  Administered 2015-05-21: 10 mg via INTRAVENOUS

## 2015-05-21 MED ORDER — FENTANYL CITRATE (PF) 100 MCG/2ML IJ SOLN
INTRAMUSCULAR | Status: AC
Start: 2015-05-21 — End: 2015-05-21
  Filled 2015-05-21: qty 2

## 2015-05-21 MED ORDER — MIDAZOLAM HCL 2 MG/2ML IJ SOLN
1.0000 mg | INTRAMUSCULAR | Status: DC | PRN
  Administered 2015-05-21 (×2): 1 mg via INTRAVENOUS

## 2015-05-21 MED ORDER — BUPIVACAINE-EPINEPHRINE (PF) 0.5% -1:200000 IJ SOLN
INTRAMUSCULAR | Status: DC | PRN
  Administered 2015-05-21: 30 mL via PERINEURAL

## 2015-05-21 MED ORDER — FENTANYL CITRATE (PF) 100 MCG/2ML IJ SOLN
INTRAMUSCULAR | Status: DC | PRN
  Administered 2015-05-21 (×4): 50 ug via INTRAVENOUS

## 2015-05-21 MED ORDER — OXYCODONE-ACETAMINOPHEN 5-325 MG PO TABS: 1.0000 | ORAL_TABLET | ORAL | Status: DC | PRN

## 2015-05-21 MED ORDER — OXYCODONE-ACETAMINOPHEN 5-325 MG PO TABS
ORAL_TABLET | ORAL | Status: AC
  Filled 2015-05-21: qty 1

## 2015-05-21 MED ORDER — GLYCOPYRROLATE 0.2 MG/ML IJ SOLN: 0.2000 mg | Freq: Once | INTRAMUSCULAR | Status: DC | PRN

## 2015-05-21 MED ORDER — FENTANYL CITRATE (PF) 100 MCG/2ML IJ SOLN
50.0000 ug | INTRAMUSCULAR | Status: DC | PRN
  Administered 2015-05-21 (×2): 50 ug via INTRAVENOUS

## 2015-05-21 MED ORDER — VANCOMYCIN HCL IN DEXTROSE 1-5 GM/200ML-% IV SOLN
1000.0000 mg | INTRAVENOUS | Status: AC
  Administered 2015-05-21 (×2): 1000 mg via INTRAVENOUS

## 2015-05-21 SURGICAL SUPPLY — 45 items
APPLIER CLIP 9.375 MED OPEN (MISCELLANEOUS) ×3
APR CLP MED 9.3 20 MLT OPN (MISCELLANEOUS) ×1
BLADE SURG 15 STRL LF DISP TIS (BLADE) ×1 IMPLANT
BLADE SURG 15 STRL SS (BLADE) ×3
CANISTER SUC SOCK COL 7IN (MISCELLANEOUS) IMPLANT
CANISTER SUCT 1200ML W/VALVE (MISCELLANEOUS) IMPLANT
CHLORAPREP W/TINT 26ML (MISCELLANEOUS) ×3 IMPLANT
CLIP APPLIE 9.375 MED OPEN (MISCELLANEOUS) ×1 IMPLANT
COVER BACK TABLE 60X90IN (DRAPES) ×3 IMPLANT
COVER MAYO STAND STRL (DRAPES) ×3 IMPLANT
COVER PROBE W GEL 5X96 (DRAPES) ×3 IMPLANT
DECANTER SPIKE VIAL GLASS SM (MISCELLANEOUS) IMPLANT
DEVICE DUBIN W/COMP PLATE 8390 (MISCELLANEOUS) ×3 IMPLANT
DRAPE LAPAROSCOPIC ABDOMINAL (DRAPES) ×3 IMPLANT
DRAPE UTILITY XL STRL (DRAPES) ×3 IMPLANT
ELECT COATED BLADE 2.86 ST (ELECTRODE) ×3 IMPLANT
ELECT REM PT RETURN 9FT ADLT (ELECTROSURGICAL) ×3
ELECTRODE REM PT RTRN 9FT ADLT (ELECTROSURGICAL) ×1 IMPLANT
GLOVE BIO SURGEON STRL SZ 6.5 (GLOVE) ×2 IMPLANT
GLOVE BIO SURGEON STRL SZ7.5 (GLOVE) ×3 IMPLANT
GLOVE BIO SURGEONS STRL SZ 6.5 (GLOVE) ×2
GLOVE BIOGEL PI IND STRL 7.0 (GLOVE) IMPLANT
GLOVE BIOGEL PI INDICATOR 7.0 (GLOVE) ×2
GOWN STRL REUS W/ TWL LRG LVL3 (GOWN DISPOSABLE) ×2 IMPLANT
GOWN STRL REUS W/TWL LRG LVL3 (GOWN DISPOSABLE) ×6
KIT MARKER MARGIN INK (KITS) ×3 IMPLANT
LIQUID BAND (GAUZE/BANDAGES/DRESSINGS) ×3 IMPLANT
NDL HYPO 25X1 1.5 SAFETY (NEEDLE) ×1 IMPLANT
NDL SAFETY ECLIPSE 18X1.5 (NEEDLE) IMPLANT
NEEDLE HYPO 18GX1.5 SHARP (NEEDLE)
NEEDLE HYPO 25X1 1.5 SAFETY (NEEDLE) ×3 IMPLANT
NS IRRIG 1000ML POUR BTL (IV SOLUTION) IMPLANT
PACK BASIN DAY SURGERY FS (CUSTOM PROCEDURE TRAY) ×3 IMPLANT
PENCIL BUTTON HOLSTER BLD 10FT (ELECTRODE) ×3 IMPLANT
SLEEVE SCD COMPRESS KNEE MED (MISCELLANEOUS) ×3 IMPLANT
SPONGE LAP 18X18 X RAY DECT (DISPOSABLE) ×3 IMPLANT
SUT MON AB 4-0 PC3 18 (SUTURE) ×6 IMPLANT
SUT SILK 2 0 SH (SUTURE) IMPLANT
SUT VICRYL 3-0 CR8 SH (SUTURE) ×3 IMPLANT
SYR CONTROL 10ML LL (SYRINGE) ×3 IMPLANT
TOWEL OR 17X24 6PK STRL BLUE (TOWEL DISPOSABLE) ×3 IMPLANT
TOWEL OR NON WOVEN STRL DISP B (DISPOSABLE) ×3 IMPLANT
TUBE CONNECTING 20'X1/4 (TUBING)
TUBE CONNECTING 20X1/4 (TUBING) IMPLANT
YANKAUER SUCT BULB TIP NO VENT (SUCTIONS) IMPLANT

## 2015-05-21 NOTE — Anesthesia Procedure Notes (Addendum)
Anesthesia Regional Block:  Pectoralis block  Pre-Anesthetic Checklist: ,, timeout performed, Correct Patient, Correct Site, Correct Laterality, Correct Procedure, Correct Position, site marked, Risks and benefits discussed, pre-op evaluation, post-op pain management  Laterality: Right  Prep: Maximum Sterile Barrier Precautions used and chloraprep       Needles:  Injection technique: Single-shot  Needle Type: Echogenic Stimulator Needle     Needle Length: 9cm 9 cm Needle Gauge: 21 and 21 G    Additional Needles:  Procedures: ultrasound guided (picture in chart) Pectoralis block Narrative:  Start time: 05/21/2015 7:16 AM End time: 05/21/2015 7:26 AM Injection made incrementally with aspirations every 5 mL. Anesthesiologist: Roderic Palau  Additional Notes: 2% Lidocaine skin wheel.   Procedure Name: LMA Insertion Date/Time: 05/21/2015 7:37 AM Performed by: Toula Moos L Pre-anesthesia Checklist: Patient identified, Emergency Drugs available, Suction available, Patient being monitored and Timeout performed Patient Re-evaluated:Patient Re-evaluated prior to inductionOxygen Delivery Method: Circle System Utilized Preoxygenation: Pre-oxygenation with 100% oxygen Intubation Type: IV induction Ventilation: Mask ventilation without difficulty LMA: LMA inserted LMA Size: 4.0 Number of attempts: 1 Airway Equipment and Method: Bite block Placement Confirmation: positive ETCO2 Tube secured with: Tape Dental Injury: Teeth and Oropharynx as per pre-operative assessment

## 2015-05-21 NOTE — Transfer of Care (Signed)
Immediate Anesthesia Transfer of Care Note  Patient: Kathryn Sanchez  Procedure(s) Performed: Procedure(s): RADIOACTIVE SEED GUIDED RIGHT PARTIAL MASTECTOMY WITH AXILLARY SENTINEL LYMPH NODE BIOPSY (Right)  Patient Location: PACU  Anesthesia Type:GA combined with regional for post-op pain  Level of Consciousness: awake  Airway & Oxygen Therapy: Patient Spontanous Breathing and Patient connected to face mask oxygen  Post-op Assessment: Report given to RN and Post -op Vital signs reviewed and stable  Post vital signs: Reviewed and stable  Last Vitals:  Filed Vitals:   05/21/15 0910  BP:   Pulse: 104  Temp:   Resp:     Complications: No apparent anesthesia complications

## 2015-05-21 NOTE — Anesthesia Preprocedure Evaluation (Addendum)
Anesthesia Evaluation  Patient identified by MRN, date of birth, ID band Patient awake    Reviewed: Allergy & Precautions, H&P , NPO status , Patient's Chart, lab work & pertinent test results  Airway Mallampati: II  TM Distance: >3 FB Neck ROM: Full    Dental no notable dental hx. (+) Teeth Intact, Dental Advisory Given   Pulmonary neg pulmonary ROS, former smoker,  breath sounds clear to auscultation  Pulmonary exam normal       Cardiovascular + dysrhythmias Atrial Fibrillation Rhythm:Regular Rate:Normal     Neuro/Psych Depression negative neurological ROS     GI/Hepatic Neg liver ROS, GERD-  Controlled and Medicated,  Endo/Other  negative endocrine ROS  Renal/GU negative Renal ROS  negative genitourinary   Musculoskeletal  (+) Arthritis -, Osteoarthritis,    Abdominal   Peds  Hematology negative hematology ROS (+)   Anesthesia Other Findings   Reproductive/Obstetrics negative OB ROS                            Anesthesia Physical Anesthesia Plan  ASA: III  Anesthesia Plan: General and Regional   Post-op Pain Management: GA combined w/ Regional for post-op pain   Induction: Intravenous  Airway Management Planned: LMA  Additional Equipment:   Intra-op Plan:   Post-operative Plan: Extubation in OR  Informed Consent: I have reviewed the patients History and Physical, chart, labs and discussed the procedure including the risks, benefits and alternatives for the proposed anesthesia with the patient or authorized representative who has indicated his/her understanding and acceptance.   Dental advisory given  Plan Discussed with: CRNA  Anesthesia Plan Comments:        Anesthesia Quick Evaluation

## 2015-05-21 NOTE — Anesthesia Postprocedure Evaluation (Signed)
  Anesthesia Post-op Note  Patient: Kathryn Sanchez  Procedure(s) Performed: Procedure(s): RADIOACTIVE SEED GUIDED RIGHT PARTIAL MASTECTOMY WITH AXILLARY SENTINEL LYMPH NODE BIOPSY (Right)  Patient Location: PACU  Anesthesia Type:General and block  Level of Consciousness: awake and alert   Airway and Oxygen Therapy: Patient Spontanous Breathing  Post-op Pain: Controlled  Post-op Assessment: Post-op Vital signs reviewed, Patient's Cardiovascular Status Stable and Respiratory Function Stable  Post-op Vital Signs: Reviewed  Filed Vitals:   05/21/15 0945  BP: 141/77  Pulse: 82  Temp:   Resp: 19    Complications: No apparent anesthesia complications

## 2015-05-21 NOTE — Interval H&P Note (Signed)
History and Physical Interval Note:  05/21/2015 6:58 AM  Kathryn Sanchez  has presented today for surgery, with the diagnosis of RIGHT BREAST CANCER  The various methods of treatment have been discussed with the patient and family. After consideration of risks, benefits and other options for treatment, the patient has consented to  Procedure(s): RADIOACTIVE SEED GUIDED RIGHT PARTIAL MASTECTOMY WITH AXILLARY SENTINEL LYMPH NODE BIOPSY (Right) as a surgical intervention .  The patient's history has been reviewed, patient examined, no change in status, stable for surgery.  I have reviewed the patient's chart and labs.  Questions were answered to the patient's satisfaction.     TOTH III,Wilman Tucker S

## 2015-05-21 NOTE — Progress Notes (Signed)
Assisted Dr. Edmond Fitzgerald with right, ultrasound guided, pectoralis block. Side rails up, monitors on throughout procedure. See vital signs in flow sheet. Tolerated Procedure well. 

## 2015-05-21 NOTE — H&P (Signed)
Kathryn Sanchez 05/01/2015 9:08 AM Location: [facility] Patient #: [id number] DOB: [birth year] Undefined / Language: [name] / Race: Undefined Female  History of Present Illness [name] S. Marlou Starks MD; 05/01/2015 3:23 PM) The patient is a 74 year old female who presents with breast cancer. We are asked to see the patient in consultation by Dr. Barbette Hair to evaluate her for a right breast cancer. The patient is a 74 year old white female who recently went for a routine screening mammogram. At that time she was found to have an abnormality in the 6:00 position of the right breast. This measured 7 mm by ultrasound. 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 She was ER and PR positive and HER-2 negative with a Ki-67 of 13%. She denies any breast pain or discharge from the nipple. She does take hormone replacement therapy.   Other Problems [name], CMA; 05/01/2015 9:08 AM) Anxiety Disorder Arthritis Atrial Fibrillation Bladder Problems Breast Cancer Depression Gastric Ulcer Gastroesophageal Reflux Disease Hemorrhoids Hypercholesterolemia Lump In Breast Oophorectomy Bilateral. Other disease, cancer, significant illness Transfusion history  Past Surgical History [name], CMA; 05/01/2015 9:08 AM) Appendectomy Breast Biopsy Right. Cataract Surgery Bilateral. Colon Polyp Removal - Colonoscopy Esophagomyotomy Hysterectomy (not due to cancer) - Complete Oral Surgery Spinal Surgery - Lower Back Tonsillectomy  Diagnostic Studies History [name], CMA; 05/01/2015 9:08 AM) Colonoscopy 1-5 years ago Mammogram within last year Pap Smear 1-5 years ago  Social History [name], [name]; 05/01/2015 9:08 AM) Alcohol use Moderate alcohol use. No caffeine use No drug use Tobacco use Former smoker.  Family History [name], [name]; 05/01/2015 9:08 AM) Arthritis Father. Breast Cancer Family Members In  General. Cerebrovascular Accident Father. Heart Disease Brother, Family Members In General, Father. Heart disease in female family member before age 54 Heart disease in female family member before age 49 Hypertension Mother. Seizure disorder Daughter.  Pregnancy / Birth History [name], [name]; 05/01/2015 9:08 AM) Age at menarche 69 years. Age of menopause <45 Contraceptive History Intrauterine device, Oral contraceptives. Gravida 3 Irregular periods Maternal age 71-30 Para 2  Review of Systems [name] CMA; 05/01/2015 9:08 AM) General Not Present- Appetite Loss, Chills, Fatigue, Fever, Night Sweats, Weight Gain and Weight Loss. Skin Present- Dryness. Not Present- Change in Wart/Mole, Hives, Jaundice, New Lesions, Non-Healing Wounds, Rash and Ulcer. HEENT Present- Seasonal Allergies and Wears glasses/contact lenses. Not Present- Earache, Hearing Loss, Hoarseness, Nose Bleed, Oral Ulcers, Ringing in the Ears, Sinus Pain, Sore Throat, Visual Disturbances and Yellow Eyes. Respiratory Present- Snoring. Not Present- Bloody sputum, Chronic Cough, Difficulty Breathing and Wheezing. Breast Not Present- Breast Mass, Breast Pain, Nipple Discharge and Skin Changes. Cardiovascular Present- Chest Pain and Palpitations. Not Present- Difficulty Breathing Lying Down, Leg Cramps, Rapid Heart Rate, Shortness of Breath and Swelling of Extremities. Gastrointestinal Not Present- Abdominal Pain, Bloating, Bloody Stool, Change in Bowel Habits, Chronic diarrhea, Constipation, Difficulty Swallowing, Excessive gas, Gets full quickly at meals, Hemorrhoids, Indigestion, Nausea, Rectal Pain and Vomiting. Female Genitourinary Present- Frequency and Urgency. Not Present- Nocturia, Painful Urination and Pelvic Pain. Musculoskeletal Present- Joint Pain and Joint Stiffness. Not Present- Back Pain, Muscle Pain, Muscle Weakness and Swelling of Extremities. Neurological Present- Numbness. Not Present-  Decreased Memory, Fainting, Headaches, Seizures, Tingling, Tremor, Trouble walking and Weakness. Psychiatric Present- Depression. Not Present- Anxiety, Bipolar, Change in Sleep Pattern, Fearful and Frequent crying. Endocrine Not Present- Cold Intolerance, Excessive Hunger, Hair Changes, Heat Intolerance, Hot flashes and New Diabetes. Hematology Present- Easy Bruising. Not  Present- Excessive bleeding, Gland problems, HIV and Persistent Infections.   Physical Exam [name] S. Marlou Starks MD; 05/01/2015 3:23 PM) General Mental Status-Alert. General Appearance-Consistent with stated age. Hydration-Well hydrated. Voice-Normal.  Head and Neck Head-normocephalic, atraumatic with no lesions or palpable masses. Trachea-midline. Thyroid Gland Characteristics - normal size and consistency.  Eye Eyeball - Bilateral-Extraocular movements intact. Sclera/Conjunctiva - Bilateral-No scleral icterus.  Chest and Lung Exam Chest and lung exam reveals -quiet, even and easy respiratory effort with no use of accessory muscles and on auscultation, normal breath sounds, no adventitious sounds and normal vocal resonance. Inspection Chest Wall - Normal. Back - normal.  Breast Note: There is no palpable mass in either breast. There is no palpable axillary, supraclavicular, or cervical lymphadenopathy.   Cardiovascular Cardiovascular examination reveals -normal heart sounds, regular rate and rhythm with no murmurs and normal pedal pulses bilaterally.  Abdomen Inspection Inspection of the abdomen reveals - No Hernias. Skin - Scar - no surgical scars. Palpation/Percussion Palpation and Percussion of the abdomen reveal - Soft, Non Tender, No Rebound tenderness, No Rigidity (guarding) and No hepatosplenomegaly. Auscultation Auscultation of the abdomen reveals - Bowel sounds normal.  Neurologic Neurologic evaluation reveals -alert and oriented x 3 with no impairment of recent or remote  memory. Mental Status-Normal.  Musculoskeletal Normal Exam - Left-Upper Extremity Strength Normal and Lower Extremity Strength Normal. Normal Exam - Right-Upper Extremity Strength Normal and Lower Extremity Strength Normal.  Lymphatic Head & Neck  General Head & Neck Lymphatics: Bilateral - Description - Normal. Axillary  General Axillary Region: Bilateral - Description - Normal. Tenderness - Non Tender. Femoral & Inguinal  Generalized Femoral & Inguinal Lymphatics: Bilateral - Description - Normal. Tenderness - Non Tender.    Assessment & Plan [name] S. Marlou Starks MD; 05/01/2015 3:25 PM) PRIMARY CANCER OF LOWER OUTER QUADRANT OF RIGHT FEMALE BREAST (174.5  C50.511) Impression: The patient appears to have a small stage I cancer in the lower portion of the right breast. I have talked to her in detail about the different options for treatment and at this point she favors breast conservation. I think this is a very reasonable way to treat her breast cancer. Since her lymph nodes are clinically negative she is also a good candidate for sentinel node mapping. I will plan for a right breast radioactive seed localized lumpectomy and sentinel node mapping. I have discussed with her in detail the risks and benefits of the operation to do this as well as some of the technical aspects and she understands and wishes to proceed Current Plans  Pt Education - Breast Cancer: discussed with patient and provided information. Addendum Note(Paul S. Toth MD; 05/01/2015 3:31 PM) She will also require Cardiac clearance and instuctions on how to manage her coumadin. Her cardiologist is Dr. Angus Palms in [place] by Luella Cook, MD (05/01/2015 3:26 PM)

## 2015-05-21 NOTE — Discharge Instructions (Signed)

## 2015-05-21 NOTE — Op Note (Signed)
05/21/2015  9:03 AM  PATIENT:  Kathryn Sanchez  74 y.o. female  PRE-OPERATIVE DIAGNOSIS:  RIGHT BREAST CANCER  POST-OPERATIVE DIAGNOSIS:  RIGHT BREAST CANCER  PROCEDURE:  Procedure(s): RADIOACTIVE SEED GUIDED RIGHT PARTIAL MASTECTOMY WITH AXILLARY SENTINEL LYMPH NODE BIOPSY (Right)  SURGEON:  Surgeon(s) and Role:    * Jovita Kussmaul, MD - Primary  PHYSICIAN ASSISTANT:   ASSISTANTS: none   ANESTHESIA:   general  EBL:  Total I/O In: 1000 [I.V.:1000] Out: -   BLOOD ADMINISTERED:none  DRAINS: none   LOCAL MEDICATIONS USED:  MARCAINE     SPECIMEN:  Source of Specimen:  right breast tissue and sentinel node with margins  DISPOSITION OF SPECIMEN:  PATHOLOGY  COUNTS:  YES  TOURNIQUET:  * No tourniquets in log *  DICTATION: .Dragon Dictation  After informed consent was obtained the patient was brought to the operating room and placed in the supine position on the operating table. After adequate induction of general anesthesia the patient's right chest, breasts, and axillary area were prepped with ChloraPrep, allowed to dry, and draped in usual sterile manner. Previously an I-125 seed was placed in the lower aspect of the right breast to mark an area of breast cancer. Earlier in the day the patient underwent injection of 1 mCi of technetium sulfur colloid in the subareolar position on the right. At this point the neoprobe was set to technetium and the right axilla was examined. An area of increased radioactivity was identified in the axilla. A small transversely oriented incision was made with a 15 blade knife overlying this area. The incision was carried through the skin and subcutaneous drainage tissue sharply with electrocautery until the axilla was entered. Using the neoprobe to direct blunt hemostat dissection a lymph node was identified. The lymph node was excised sharply with the electrocautery and the lymphatics and vessels around it were controlled with clips. Ex vivo counts on  sentinel node #1 were approximately 3000. No other palpable or hot lymph nodes were identified. This lymph node was sent to pathology for further evaluation. Hemostasis was achieved using the Bovie electrocautery. The deep layer of the wound was closed with interrupted 3-0 Vicryl stitches. The skin was then closed with a running 4-0 Monocryl subcuticular stitch. Attention was then turned to the right breast. The neoprobe was changed to I-125. An area of radioactivity was identified in the 6:00 position. An elliptical incision was made in the skin overlying this area of radioactivity with a 15 blade knife. The incision was carried through the skin and subcutaneous tissue sharply with the electrocautery. While checking the area of radioactivity frequently a circular portion of breast tissue was excised sharply around the I-125 seed. Once the specimen was removed it was oriented with the appropriate paint colors. A specimen radiograph showed the clip in seed to be in the center of the specimen. On palpation I didn't feel like we were close to the mass. Additional superior, inferior, medial, lateral, and deep margins were also excised and marked with a stitch on the new true surgical margin. Hemostasis was achieved using the Bovie electrocautery. The wound was irrigated with saline. The cavity was marked with clips. The deep layer of the wound was closed with layers of interrupted 3-0 Vicryl stitches. The skin was then closed with interrupted 4-0 Monocryl subcuticular stitches. Dermabond dressings were applied. The patient tolerated the procedure well. At the end of the case all needle sponge and instrument counts were correct. The patient was then awakened  and taken to recovery in stable condition.  PLAN OF CARE: Discharge to home after PACU  PATIENT DISPOSITION:  PACU - hemodynamically stable.   Delay start of Pharmacological VTE agent (>24hrs) due to surgical blood loss or risk of bleeding: not applicable

## 2015-05-22 ENCOUNTER — Encounter (HOSPITAL_BASED_OUTPATIENT_CLINIC_OR_DEPARTMENT_OTHER): Payer: Self-pay | Admitting: General Surgery

## 2015-05-30 ENCOUNTER — Ambulatory Visit: Payer: Medicare Other

## 2015-05-30 ENCOUNTER — Institutional Professional Consult (permissible substitution): Payer: Medicare Other | Admitting: Radiation Oncology

## 2015-06-04 ENCOUNTER — Ambulatory Visit: Payer: Medicare Other | Admitting: Hematology and Oncology

## 2015-06-05 ENCOUNTER — Encounter: Payer: Self-pay | Admitting: Hematology and Oncology

## 2015-06-05 ENCOUNTER — Ambulatory Visit
Admission: RE | Admit: 2015-06-05 | Discharge: 2015-06-05 | Disposition: A | Discharge status: Home / Self Care | Payer: Medicare Other | Source: Ambulatory Visit | Attending: Radiation Oncology | Admitting: Radiation Oncology

## 2015-06-05 ENCOUNTER — Telehealth: Payer: Self-pay | Admitting: Hematology and Oncology

## 2015-06-05 ENCOUNTER — Institutional Professional Consult (permissible substitution): Payer: Medicare Other | Admitting: Radiation Oncology

## 2015-06-05 ENCOUNTER — Ambulatory Visit (HOSPITAL_BASED_OUTPATIENT_CLINIC_OR_DEPARTMENT_OTHER): Payer: Medicare Other | Admitting: Hematology and Oncology

## 2015-06-05 ENCOUNTER — Encounter: Payer: Self-pay | Admitting: Radiation Oncology

## 2015-06-05 VITALS — BP 141/53 | HR 70 | Temp 98.1°F | Resp 16 | Ht 62.0 in | Wt 144.3 lb

## 2015-06-05 VITALS — BP 162/68 | HR 72 | Temp 98.1°F | Resp 18 | Ht 62.0 in | Wt 143.5 lb

## 2015-06-05 DIAGNOSIS — C50511 Malignant neoplasm of lower-outer quadrant of right female breast: Secondary | ICD-10-CM | POA: Diagnosis present

## 2015-06-05 DIAGNOSIS — Z510 Encounter for antineoplastic radiation therapy: Secondary | ICD-10-CM | POA: Diagnosis present

## 2015-06-05 DIAGNOSIS — Z170 Estrogen receptor positive status [ER+]: Secondary | ICD-10-CM | POA: Diagnosis not present

## 2015-06-05 MED ORDER — ANASTROZOLE 1 MG PO TABS: 1.0000 mg | ORAL_TABLET | Freq: Every day | ORAL | Status: DC

## 2015-06-05 NOTE — Assessment & Plan Note (Signed)
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 I discussed the lymph node negativity as well as once again discussed significance of ER and PR and HER-2 receptors.  Recommendation: 1. Adjuvant radiation therapy 2. Followed by oral antiestrogen therapy  Return to clinic after radiation to start antiestrogen therapy

## 2015-06-05 NOTE — Progress Notes (Signed)
Location of Breast Cancer: Breast cancer of lower-outer quadrant of right female breast  Histology per Pathology Report:   05/21/15 Diagnosis 1. Lymph node, sentinel, biopsy, #1 - ONE BENIGN LYMPH NODE (0/1). 2. Breast, lumpectomy, Right - INVASIVE DUCTAL CARCINOMA WITH CALCIFICATIONS, 1.4 CM. - DUCTAL CARCINOMA IN SITU. - MARGINS NOT INVOLVED. - CLOSEST MARGIN LATERAL AT 0.1 CM. - SEE ONCOLOGY TABLE BELOW. 3. Breast, excision, Right medial margin - BENIGN BREAST TISSUE. - NO TUMOR IDENTIFIED. 4. Breast, excision, Right inferior margin - BENIGN BREAST TISSUE. - NO TUMOR IDENTIFIED. 5. Breast, excision, Right lateral margin - FIBROCYSTIC CHANGES WITH CALCIFICATIONS. - NO TUMOR IDENTIFIED. 6. Breast, excision, Right superior margin - FIBROCYSTIC CHANGES. - NO TUMOR IDENTIFIED. 7. Breast, excision, Right deep margin - BENIGN BREAST TISSUE. - NO TUMOR IDENTIFIED. 8. Lymph node, sentinel, biopsy, Right - ONE BENIGN LYMPH NODE (0/1). 9. Lymph node, sentinel, biopsy, Right - ONE BENIGN LYMPH NODE (0/1). Microscopic Comment  04/24/15   Receptor Status: ER(100%+), PR (90%+), Her2-neu (negative)  Did patient present with symptoms (if so, please note symptoms) or was this found on screening mammography?: routine screening mammography  Past/Anticipated interventions by surgeon, if any: 05/21/15 - Procedure: RADIOACTIVE SEED GUIDED RIGHT PARTIAL MASTECTOMY WITH AXILLARY SENTINEL LYMPH NODE BIOPSY;  Surgeon: Autumn Messing III, MD;  Location: [facility];  Service: General;  Laterality: Right;  Past/Anticipated interventions by medical oncology, if any: Apt with Dr. Lindi Adie 06/05/15 - Dr. Lindi Adie recommends "Adjuvant radiation followed by Antiestrogen therapy with anastrozole 1 mg daily 5 years bone density test done on 04/24/2015 showed T score of -1 normal bone density.  Lymphedema issues, if any:  no  Pain issues, if any:  no - dose have soreness in right breast from surgery on  05/21/15  OB GYN history:  Patient was 13 at first menses, She was 89 years with birth of first child.  She has 2 children.  She has used bcp and hormone replacement.  SAFETY ISSUES:  Prior radiation? no  Pacemaker/ICD? no  Possible current pregnancy?no  Is the patient on methotrexate? no  Current Complaints / other details:  Patient is here with her husband.   BP 141/53 mmHg  Pulse 70  Temp(Src) 98.1 F (36.7 C) (Oral)  Resp 16

## 2015-06-05 NOTE — Telephone Encounter (Signed)
Appointments made and avs printed for patient °

## 2015-06-05 NOTE — Progress Notes (Signed)
Patient Care Team: Provider Not In System as PCP - General (Family Medicine) Autumn Messing III, MD as Consulting Physician (General Surgery) Nicholas Lose, MD as Consulting Physician (Hematology and Oncology) Gery Pray, MD as Consulting Physician (Radiation Oncology) Mauro Kaufmann, RN as Registered Nurse Rockwell Germany, RN as Registered Nurse Holley Bouche, NP as Nurse Practitioner (Nurse Practitioner)  DIAGNOSIS: Breast cancer of lower-outer quadrant of right female breast   Staging form: Breast, AJCC 7th Edition     Clinical stage from 05/01/2015: Stage IA (T1b, N0, M0) - Unsigned       Staging comments: Staged at breast conference on 7/.20.16  SUMMARY OF ONCOLOGIC HISTORY:   Breast cancer of lower-outer quadrant of right female breast   04/11/2015 Mammogram Right breast irregular mass with calcifications extend 5 mm anterior to the mass, mass itself is 7 mm at 6:00 position, axilla normal   04/24/2015 Initial Diagnosis Right breast biopsy 6:00: 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, ER/PR positive HER-2 negative Ki-67 13%; with associated microcalcifications with an area of benign adenosis   05/21/2015 Surgery Right lumpectomy: IVC with calcifications 1.4 cm with DCIS, margins negative, 0/3 lymph node,ER 100%, PR 90%, HER-2 negative, Ki-67 13%, T1 cN0 stage IA    CHIEF COMPLIANT: follow-up after surgery  INTERVAL HISTORY: Kathryn Sanchez is a 74 year old with above-mentioned history of right-sided breast cancer who had a lumpectomy and is here today for discuss the results. We present her case in the tumor board and she is here today to discuss the consensus treatment plan.she is recovering very well from surgery and does not have any complaints or concerns today.  REVIEW OF SYSTEMS:   Constitutional: Denies fevers, chills or abnormal weight loss Eyes: Denies blurriness of vision Ears, nose, mouth, throat, and face: Denies mucositis or sore throat Respiratory: Denies cough,  dyspnea or wheezes Cardiovascular: Denies palpitation, chest discomfort or lower extremity swelling Gastrointestinal:  Denies nausea, heartburn or change in bowel habits Skin: Denies abnormal skin rashes Lymphatics: Denies new lymphadenopathy or easy bruising Neurological:Denies numbness, tingling or new weaknesses Behavioral/Psych: Mood is stable, no new changes  Breast:  Recovering very well from surgery All other systems were reviewed with the patient and are negative.  I have reviewed the past medical history, past surgical history, social history and family history with the patient and they are unchanged from previous note.  ALLERGIES:  is allergic to penicillins; clarithromycin; morphine and related; sulfa antibiotics; amoxil; cephalexin; ciprofloxacin; flexeril; and metronidazole.  MEDICATIONS:  Current Outpatient Prescriptions  Medication Sig Dispense Refill  . buPROPion (WELLBUTRIN XL) 300 MG 24 hr tablet 1 po  daily    . busPIRone (BUSPAR) 15 MG tablet 1 by mouth daily    . omeprazole (PRILOSEC) 20 MG capsule Take by mouth.    Marland Kitchen PARoxetine (PAXIL) 10 MG tablet 1 by mouth daily    . solifenacin (VESICARE) 10 MG tablet     . Vitamin D, Ergocalciferol, (DRISDOL) 50000 UNITS CAPS capsule Take 50,000 Units by mouth every 7 (seven) days. Monday    . warfarin (COUMADIN) 2.5 MG tablet Take 2.5 mg by mouth daily.    Marland Kitchen anastrozole (ARIMIDEX) 1 MG tablet Take 1 tablet (1 mg total) by mouth daily. 90 tablet 3   No current facility-administered medications for this visit.    PHYSICAL EXAMINATION: ECOG PERFORMANCE STATUS: 1 - Symptomatic but completely ambulatory  Filed Vitals:   06/05/15 1014  BP: 162/68  Pulse: 72  Temp: 98.1 F (36.7  C)  Resp: 18   Filed Weights   06/05/15 1014  Weight: 143 lb 8 oz (65.091 kg)    GENERAL:alert, no distress and comfortable SKIN: skin color, texture, turgor are normal, no rashes or significant lesions EYES: normal, Conjunctiva are pink and  non-injected, sclera clear OROPHARYNX:no exudate, no erythema and lips, buccal mucosa, and tongue normal  NECK: supple, thyroid normal size, non-tender, without nodularity LYMPH:  no palpable lymphadenopathy in the cervical, axillary or inguinal LUNGS: clear to auscultation and percussion with normal breathing effort HEART: regular rate & rhythm and no murmurs and no lower extremity edema ABDOMEN:abdomen soft, non-tender and normal bowel sounds Musculoskeletal:no cyanosis of digits and no clubbing  NEURO: alert & oriented x 3 with fluent speech, no focal motor/sensory deficits  LABORATORY DATA:  I have reviewed the data as listed   Chemistry      Component Value Date/Time   NA 140 05/01/2015 1248   K 3.9 05/01/2015 1248   CO2 25 05/01/2015 1248   BUN 16.1 05/01/2015 1248   CREATININE 1.0 05/01/2015 1248      Component Value Date/Time   CALCIUM 9.4 05/01/2015 1248   ALKPHOS 95 05/01/2015 1248   AST 31 05/01/2015 1248   ALT 29 05/01/2015 1248   BILITOT 0.45 05/01/2015 1248       Lab Results  Component Value Date   WBC 5.6 05/01/2015   HGB 13.9 05/01/2015   HCT 42.3 05/01/2015   MCV 97.5 05/01/2015   PLT 254 05/01/2015   NEUTROABS 3.1 05/01/2015    ASSESSMENT & PLAN:  Breast cancer of lower-outer quadrant of right female breast Right lumpectomy 05/21/2015 : 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 I discussed the lymph node negativity as well as once again discussed significance of ER and PR and HER-2 receptors. Based on her age and comorbidities and performance status, I did not recommend systemic chemotherapy.  Recommendation: 1. Adjuvant radiation therapy 2. Followed by oral antiestrogen therapy  Anastrozole counseling:We discussed the risks and benefits of  anti-estrogen therapy with aromatase inhibitors. These include but not limited to insomnia, hot flashes, mood changes, vaginal dryness, bone density loss, and weight gain. Although rare, serious side effects including endometrial cancer, risk of blood clots were also discussed. We strongly believe that the benefits far outweigh the risks. Patient understands these risks and consented to starting treatment. Planned treatment duration is 5 years.  Return to clinic 1 month after starting antiestrogen therapy which would be approximately mid November. So we will see her back in mid December.  No orders of the defined types were placed in this encounter.   The patient has a good understanding of the overall plan. she agrees with it. she will call with any problems that may develop before the next visit here.   Rulon Eisenmenger, MD  Limited brain MRI would be

## 2015-06-05 NOTE — Progress Notes (Signed)
Please see the Nurse Progress Note in the MD Initial Consult Encounter for this patient. 

## 2015-06-05 NOTE — Progress Notes (Signed)
Radiation Oncology         (336) [phone] ________________________________  Name: Kathryn Sanchez MRN: [id number]  Date: 06/05/2015  DOB: 13-May-1941  Re-Evaluation Visit Note  CC: PROVIDER NOT IN SYSTEM  Jovita Kussmaul, MD    ICD-9-CM ICD-10-CM   1. 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 She was initially seen in the multidisciplinary breast clinic. She proceeded with her definitive surgery of radioactive seed guided partial mastectomy with axillary sentinel lymph node biopsy of the right breast on 05/21/15. 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 The surgical margins were clear with the closest margin greater than 1 cm. She had 2 benign sentinel lymph nodes removed. The pt is recovering well from surgery. She did meet with med/onc earlier today who recommended adjuvant hormonal therapy. Pt is now seen in rad/onc for the discussion of adjuvant radiation for breast conservation.  ALLERGIES:  is allergic to penicillins; clarithromycin; morphine and related; sulfa antibiotics; amoxil; cephalexin; ciprofloxacin; flexeril; and metronidazole.  Meds: Current Outpatient Prescriptions  Medication Sig Dispense Refill  . buPROPion (WELLBUTRIN XL) 300 MG 24 hr tablet 1 po  daily    . busPIRone (BUSPAR) 15 MG tablet 1 by mouth daily    . omeprazole (PRILOSEC) 20 MG capsule Take by mouth.    Marland Kitchen PARoxetine (PAXIL) 10 MG tablet 1 by mouth daily    . solifenacin (VESICARE) 10 MG tablet     . Vitamin D, Ergocalciferol, (DRISDOL) 50000 UNITS CAPS capsule Take 50,000 Units by mouth every 7 (seven) days. Monday    . warfarin (COUMADIN) 2.5 MG tablet Take 2.5 mg by mouth daily.    Marland Kitchen anastrozole (ARIMIDEX) 1 MG tablet Take 1 tablet (1 mg total) by mouth daily. (Patient not taking: Reported on 06/05/2015) 90  tablet 3   No current facility-administered medications for this encounter.    Physical Findings: The patient is in no acute distress. Patient is alert and oriented.  height is 5\' 2"  (1.575 m) and weight is 144 lb 4.8 oz (65.454 kg). Her oral temperature is 98.1 F (36.7 C). Her blood pressure is 141/53 and her pulse is 70. Her respiration is 16.   General: Alert and oriented, in no acute distress  Neck: Neck is supple, no palpable cervical or supraclavicular lymphadenopathy. Heart: Regular in rate and rhythm with no murmurs, rubs, or gallops. Chest: Clear to auscultation bilaterally, with no rhonchi, wheezes, or rales. Right breast shows a vertical scar in the approximal 6:30 position of the right breast. A secondary incision on the right axilla is also noted. Both are healing well with no signs of infection.    Lab Findings: Lab Results  Component Value Date   WBC 5.6 05/01/2015   HGB 13.9 05/01/2015   HCT 42.3 05/01/2015   MCV 97.5 05/01/2015   PLT 254 05/01/2015    Radiographic Findings: Nm Sentinel Node Inj-no Rpt (breast)  05/21/2015   CLINICAL DATA: right breast cancer   Sulfur colloid was injected intradermally by the nuclear medicine  technologist for breast cancer sentinel node localization.     Impression:  Stage I Invasive Ductal Carinoma of the Right Breast. The pt has a good performance status for her age and remains very active without any significant medical issues. I would recommend radiotherapy in this situation in  addition to hormonal treatment. The pt would like to be aggressive with her treatment and would like to receive both radiation and adjuvant hormonal therapy.   Plan: The pt lives in the [place] area of [place]. She would like to receive radiation in the [facility] in [place], [place]. She will be set up for simulation and txt at that facility with simulation scheduled on September 12. Txts are to begin soon afterward. The pt will be  treated with hypo-fractionated radiotherapy if technically possible.  This document serves as a record of services personally performed by Gery Pray, MD. It was created on his behalf by Darcus Austin, a trained medical scribe. The creation of this record is based on the scribe's personal observations and the provider's statements to them. This document has been checked and approved by the attending provider.  ____________________________________  Blair Promise, PhD, MD

## 2015-06-25 ENCOUNTER — Ambulatory Visit
Admission: RE | Admit: 2015-06-25 | Discharge: 2015-06-25 | Disposition: A | Discharge status: Home / Self Care | Payer: Medicare Other | Source: Ambulatory Visit | Attending: Radiation Oncology | Admitting: Radiation Oncology

## 2015-06-25 DIAGNOSIS — C50511 Malignant neoplasm of lower-outer quadrant of right female breast: Secondary | ICD-10-CM

## 2015-06-25 DIAGNOSIS — Z510 Encounter for antineoplastic radiation therapy: Secondary | ICD-10-CM | POA: Diagnosis not present

## 2015-06-26 NOTE — Progress Notes (Signed)
  Radiation Oncology         [phone] ________________________________  Name: Kathryn Sanchez MRN: [id number]  Date: 06/25/2015  DOB: June 10, 1941  SIMULATION AND TREATMENT PLANNING NOTE    ICD-9-CM ICD-10-CM   1. 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  Identity was confirmed.  All relevant records and images related to the planned course of therapy were reviewed.  The patient freely provided informed written consent to proceed with treatment after reviewing the details related to the planned course of therapy. The consent form was witnessed and verified by the simulation staff.  Then, the patient was set-up in a stable reproducible  supine position for radiation therapy.  CT images were obtained.  Surface markings were placed.  The CT images were loaded into the planning software.  Then the target and avoidance structures were contoured.  Treatment planning then occurred.  The radiation prescription was entered and confirmed.  Then, I designed and supervised the construction of a total of 3 medically necessary complex treatment devices.  I have requested : 3D Simulation  I have requested a DVH of the following structures: heart lungs, lumpectomy cavity.  I have ordered:dose calc.  PLAN:  The patient will receive 42.72 Gy in 16 fractions followed by a boost to the lumpectomy cavity of 10 gray and a cumulative dose of 52.72 gray  ________________________________  -----------------------------------  Blair Promise, PhD, MD

## 2015-06-28 DIAGNOSIS — Z510 Encounter for antineoplastic radiation therapy: Secondary | ICD-10-CM | POA: Diagnosis not present

## 2015-07-01 ENCOUNTER — Ambulatory Visit
Admission: RE | Admit: 2015-07-01 | Discharge: 2015-07-01 | Disposition: A | Discharge status: Home / Self Care | Payer: Medicare Other | Source: Ambulatory Visit | Attending: Radiation Oncology | Admitting: Radiation Oncology

## 2015-07-01 DIAGNOSIS — Z510 Encounter for antineoplastic radiation therapy: Secondary | ICD-10-CM | POA: Diagnosis not present

## 2015-07-02 ENCOUNTER — Ambulatory Visit
Admission: RE | Admit: 2015-07-02 | Discharge: 2015-07-02 | Disposition: A | Discharge status: Home / Self Care | Payer: Medicare Other | Source: Ambulatory Visit | Attending: Radiation Oncology | Admitting: Radiation Oncology

## 2015-07-02 ENCOUNTER — Encounter: Payer: Self-pay | Admitting: Radiation Oncology

## 2015-07-02 VITALS — BP 135/55 | HR 82 | Resp 16 | Wt 145.9 lb

## 2015-07-02 DIAGNOSIS — Z510 Encounter for antineoplastic radiation therapy: Secondary | ICD-10-CM | POA: Diagnosis not present

## 2015-07-02 DIAGNOSIS — C50511 Malignant neoplasm of lower-outer quadrant of right female breast: Secondary | ICD-10-CM | POA: Diagnosis present

## 2015-07-02 MED ORDER — RADIAPLEXRX EX GEL
Freq: Once | CUTANEOUS | Status: AC
  Administered 2015-07-02: 14:00:00 via TOPICAL

## 2015-07-02 MED ORDER — ALRA NON-METALLIC DEODORANT (RAD-ONC)
1.0000 | Freq: Once | TOPICAL | Status: AC
Start: 2015-07-02 — End: 2015-07-02
  Administered 2015-07-02: 1 via TOPICAL

## 2015-07-02 NOTE — Progress Notes (Addendum)
Weight and vitals. Denies pain. No skin changes within treatment field. Denies fatigue. Patient without complaints today. Oriented patient to staff and routine of the clinic. Provided patient with RADIATION THERAPY AND YOU handbook then, reviewed pertinent information. Educated patient reference potential side effects and management such as, fatigue and skin changes. Provided patient with radiaplex and alra then, directed upon use. Patient verbalized understanding of all reviewed.   BP 135/55 mmHg  Pulse 82  Resp 16  Wt 145 lb 14.4 oz (66.18 kg) Wt Readings from Last 3 Encounters:  07/02/15 145 lb 14.4 oz (66.18 kg)  06/05/15 144 lb 4.8 oz (65.454 kg)  06/05/15 143 lb 8 oz (65.091 kg)

## 2015-07-02 NOTE — Progress Notes (Signed)
  Radiation Oncology         (336) [phone] ________________________________  Name: Kathryn Sanchez MRN: [id number]  Date: 07/02/2015  DOB: January 18, 1941  Weekly Radiation Therapy Management  Breast cancer of lower-outer quadrant of right female breast - Plan: hyaluronate sodium (RADIAPLEXRX) gel, non-metallic deodorant (ALRA) 1 application    Current Dose: 2.67 Gy     Planned Dose:  52.72 Gy  Narrative . . . . . . . . The patient presents for routine under treatment assessment.                                   The patient is without complaint.                                 Set-up films were reviewed.                                 The chart was checked. Physical Findings. . .  weight is 145 lb 14.4 oz (66.18 kg). Her blood pressure is 135/55 and her pulse is 82. Her respiration is 16. . Weight essentially stable.  No significant changes. Impression . . . . . . . The patient is tolerating radiation. Plan . . . . . . . . . . . . Continue treatment as planned.  ________________________________   Blair Promise, PhD, MD

## 2015-07-03 ENCOUNTER — Ambulatory Visit
Admission: RE | Admit: 2015-07-03 | Discharge: 2015-07-03 | Disposition: A | Discharge status: Home / Self Care | Payer: Medicare Other | Source: Ambulatory Visit | Attending: Radiation Oncology | Admitting: Radiation Oncology

## 2015-07-03 DIAGNOSIS — Z510 Encounter for antineoplastic radiation therapy: Secondary | ICD-10-CM | POA: Diagnosis not present

## 2015-07-04 ENCOUNTER — Ambulatory Visit
Admission: RE | Admit: 2015-07-04 | Discharge: 2015-07-04 | Disposition: A | Discharge status: Home / Self Care | Payer: Medicare Other | Source: Ambulatory Visit | Attending: Radiation Oncology | Admitting: Radiation Oncology

## 2015-07-04 DIAGNOSIS — Z510 Encounter for antineoplastic radiation therapy: Secondary | ICD-10-CM | POA: Diagnosis not present

## 2015-07-05 ENCOUNTER — Ambulatory Visit
Admission: RE | Admit: 2015-07-05 | Discharge: 2015-07-05 | Disposition: A | Discharge status: Home / Self Care | Payer: Medicare Other | Source: Ambulatory Visit | Attending: Radiation Oncology | Admitting: Radiation Oncology

## 2015-07-05 DIAGNOSIS — Z510 Encounter for antineoplastic radiation therapy: Secondary | ICD-10-CM | POA: Diagnosis not present

## 2015-07-08 ENCOUNTER — Ambulatory Visit
Admission: RE | Admit: 2015-07-08 | Discharge: 2015-07-08 | Disposition: A | Discharge status: Home / Self Care | Payer: Medicare Other | Source: Ambulatory Visit | Attending: Radiation Oncology | Admitting: Radiation Oncology

## 2015-07-08 DIAGNOSIS — Z510 Encounter for antineoplastic radiation therapy: Secondary | ICD-10-CM | POA: Diagnosis not present

## 2015-07-09 ENCOUNTER — Ambulatory Visit
Admission: RE | Admit: 2015-07-09 | Discharge: 2015-07-09 | Disposition: A | Discharge status: Home / Self Care | Payer: Medicare Other | Source: Ambulatory Visit | Attending: Radiation Oncology | Admitting: Radiation Oncology

## 2015-07-09 VITALS — BP 128/66 | HR 86 | Temp 98.7°F | Ht 62.0 in | Wt 145.4 lb

## 2015-07-09 DIAGNOSIS — C50511 Malignant neoplasm of lower-outer quadrant of right female breast: Secondary | ICD-10-CM

## 2015-07-09 DIAGNOSIS — Z510 Encounter for antineoplastic radiation therapy: Secondary | ICD-10-CM | POA: Diagnosis not present

## 2015-07-09 NOTE — Progress Notes (Signed)
  Radiation Oncology         [phone] ________________________________  Name: Kathryn Sanchez MRN: [id number]  Date: 07/09/2015  DOB: 1940-11-23  Weekly Radiation Therapy Management  Breast cancer of lower-outer quadrant of right female breast    Current Dose: 16.020 Gy     Planned Dose:  52.72 Gy  Narrative . . . . . . . . The patient presents for routine under treatment assessment.                                   Kathryn Sanchez presents for her 6th fraction to right breast. Patient denies any pain or fatigue. She does have mild redness to anterior right breast, but denies itching. She is using Radiaplex prn.                                 Set-up films were reviewed.                                 The chart was checked.  Physical Findings. . .  height is 5\' 2"  (1.575 m) and weight is 145 lb 6.4 oz (65.953 kg). Her temperature is 98.7 F (37.1 C). Her blood pressure is 128/66 and her pulse is 86. . Weight essentially stable. Lungs are clear to auscultation bilaterally. Heart has regular rate and rhythm. Slight erythema in the txt area.  Impression . . . . . . . The patient is tolerating radiation. Plan . . . . . . . . . . . . Continue treatment as planned.  This document serves as a record of services personally performed by Gery Pray, MD. It was created on his behalf by Darcus Austin, a trained medical scribe. The creation of this record is based on the scribe's personal observations and the provider's statements to them. This document has been checked and approved by the attending provider.  ________________________________   Blair Promise, PhD, MD

## 2015-07-09 NOTE — Progress Notes (Signed)
Kathryn Sanchez presents for her 6th fraction to right breast. Patient denies any pain, or fatigue. She does have mild redness to anterior right breast, denies itching. She is using Radiaplex as needed. BP 128/66 mmHg  Pulse 86  Temp(Src) 98.7 F (37.1 C)  Ht 5\' 2"  (1.575 m)  Wt 145 lb 6.4 oz (65.953 kg)  BMI 26.59 kg/m2  Wt Readings from Last 3 Encounters:  07/09/15 145 lb 6.4 oz (65.953 kg)  07/02/15 145 lb 14.4 oz (66.18 kg)  06/05/15 144 lb 4.8 oz (65.454 kg)

## 2015-07-10 ENCOUNTER — Ambulatory Visit
Admission: RE | Admit: 2015-07-10 | Discharge: 2015-07-10 | Disposition: A | Discharge status: Home / Self Care | Payer: Medicare Other | Source: Ambulatory Visit | Attending: Radiation Oncology | Admitting: Radiation Oncology

## 2015-07-10 DIAGNOSIS — Z510 Encounter for antineoplastic radiation therapy: Secondary | ICD-10-CM | POA: Diagnosis not present

## 2015-07-11 ENCOUNTER — Ambulatory Visit
Admission: RE | Admit: 2015-07-11 | Discharge: 2015-07-11 | Disposition: A | Discharge status: Home / Self Care | Payer: Medicare Other | Source: Ambulatory Visit | Attending: Radiation Oncology | Admitting: Radiation Oncology

## 2015-07-11 ENCOUNTER — Encounter: Payer: Self-pay | Admitting: Radiation Oncology

## 2015-07-11 DIAGNOSIS — Z510 Encounter for antineoplastic radiation therapy: Secondary | ICD-10-CM | POA: Diagnosis not present

## 2015-07-11 DIAGNOSIS — C50511 Malignant neoplasm of lower-outer quadrant of right female breast: Secondary | ICD-10-CM

## 2015-07-11 NOTE — Progress Notes (Signed)
  Radiation Oncology         [phone] ________________________________  Name: Kathryn Sanchez MRN: [id number]  Date: 07/11/2015  DOB: 1941-04-25  SimulationNote    ICD-9-CM ICD-10-CM   1. Breast cancer of lower-outer quadrant of right female breast 174.5 C50.511     Status: outpatient  NARRATIVE: Today the patient underwent additional planning for radiation therapy directed at the right breast. The patient's treatment planning CT scan was reviewed and she had set up of a boost field directed at the lumpectomy cavity within the right breast. Given the depth within the breast the patient had set up of a photon boost field using 4 separate beam arrangement and custom blocks. A computerized isodose plan will be generated for treatment.  The patient will receive 5 additional treatments at 2 gray per fraction for a boost dose of 10 gray. -----------------------------------  Blair Promise, PhD, MD

## 2015-07-12 ENCOUNTER — Ambulatory Visit
Admission: RE | Admit: 2015-07-12 | Discharge: 2015-07-12 | Disposition: A | Discharge status: Home / Self Care | Payer: Medicare Other | Source: Ambulatory Visit | Attending: Radiation Oncology | Admitting: Radiation Oncology

## 2015-07-12 DIAGNOSIS — Z510 Encounter for antineoplastic radiation therapy: Secondary | ICD-10-CM | POA: Diagnosis not present

## 2015-07-15 ENCOUNTER — Ambulatory Visit
Admission: RE | Admit: 2015-07-15 | Discharge: 2015-07-15 | Disposition: A | Discharge status: Home / Self Care | Payer: Medicare Other | Source: Ambulatory Visit | Attending: Radiation Oncology | Admitting: Radiation Oncology

## 2015-07-15 DIAGNOSIS — C50511 Malignant neoplasm of lower-outer quadrant of right female breast: Secondary | ICD-10-CM | POA: Diagnosis present

## 2015-07-15 DIAGNOSIS — Z510 Encounter for antineoplastic radiation therapy: Secondary | ICD-10-CM | POA: Diagnosis not present

## 2015-07-16 ENCOUNTER — Ambulatory Visit
Admission: RE | Admit: 2015-07-16 | Discharge: 2015-07-16 | Disposition: A | Discharge status: Home / Self Care | Payer: Medicare Other | Source: Ambulatory Visit | Attending: Radiation Oncology | Admitting: Radiation Oncology

## 2015-07-16 ENCOUNTER — Encounter: Payer: Self-pay | Admitting: Radiation Oncology

## 2015-07-16 VITALS — BP 151/55 | HR 66 | Temp 97.8°F | Resp 20 | Ht 62.0 in | Wt 145.0 lb

## 2015-07-16 DIAGNOSIS — Z510 Encounter for antineoplastic radiation therapy: Secondary | ICD-10-CM | POA: Insufficient documentation

## 2015-07-16 DIAGNOSIS — C50511 Malignant neoplasm of lower-outer quadrant of right female breast: Secondary | ICD-10-CM | POA: Insufficient documentation

## 2015-07-16 MED ORDER — BIAFINE EX EMUL
Freq: Once | CUTANEOUS | Status: AC
  Administered 2015-07-16: 14:00:00 via TOPICAL

## 2015-07-16 NOTE — Progress Notes (Signed)
Weekly Management Note Current Dose: 29.37  Gy  Projected Dose:  42.72 Gy   Narrative:  The patient presents for routine under treatment assessment.  CBCT/MVCT images/Port film x-rays were reviewed.  The chart was checked. She denies pain and fatigue. She reports her right breast is itching. She has been using hydrocortisone cream and benadryl at night. She is using radiaplex gel.  Physical Findings: Weight: 145 lb (65.772 kg). Dermatitis in the medial aspect of the right breast.  Impression:  The patient is tolerating radiation.  Plan:  Continue treatment as planned. Switch to biafine cream.  This document serves as a record of services personally performed by Thea Silversmith, MD. It was created on her behalf by Darcus Austin, a trained medical scribe. The creation of this record is based on the scribe's personal observations and the provider's statements to them. This document has been checked and approved by the attending provider.

## 2015-07-16 NOTE — Addendum Note (Signed)
Encounter addended by: Jacqulyn Liner, RN on: 07/16/2015  2:09 PM<BR>     Documentation filed: [employer], Medications, Orders

## 2015-07-16 NOTE — Addendum Note (Signed)
Encounter addended by: Jacqulyn Liner, RN on: 07/16/2015  2:11 PM<BR>     Documentation filed: Inpatient MAR

## 2015-07-16 NOTE — Progress Notes (Signed)
Kathryn Sanchez has completed 11 fractions to her right breast.  She denies pain and fatigue.  She reports her right breast is itching.  She has been using hydrocortisone cream and benadryl at night.  The skin on her right breast is red with a rash on the upper portion and underneath.  She is using radiaplex gel.  BP 151/55 mmHg  Pulse 66  Temp(Src) 97.8 F (36.6 C) (Oral)  Resp 20  Ht 5\' 2"  (1.575 m)  Wt 145 lb (65.772 kg)  BMI 26.51 kg/m2

## 2015-07-17 ENCOUNTER — Ambulatory Visit
Admission: RE | Admit: 2015-07-17 | Discharge: 2015-07-17 | Disposition: A | Discharge status: Home / Self Care | Payer: Medicare Other | Source: Ambulatory Visit | Attending: Radiation Oncology | Admitting: Radiation Oncology

## 2015-07-17 DIAGNOSIS — Z510 Encounter for antineoplastic radiation therapy: Secondary | ICD-10-CM | POA: Diagnosis not present

## 2015-07-18 ENCOUNTER — Ambulatory Visit
Admission: RE | Admit: 2015-07-18 | Discharge: 2015-07-18 | Disposition: A | Discharge status: Home / Self Care | Payer: Medicare Other | Source: Ambulatory Visit | Attending: Radiation Oncology | Admitting: Radiation Oncology

## 2015-07-18 DIAGNOSIS — Z510 Encounter for antineoplastic radiation therapy: Secondary | ICD-10-CM | POA: Diagnosis not present

## 2015-07-19 ENCOUNTER — Ambulatory Visit
Admission: RE | Admit: 2015-07-19 | Discharge: 2015-07-19 | Disposition: A | Discharge status: Home / Self Care | Payer: Medicare Other | Source: Ambulatory Visit | Attending: Radiation Oncology | Admitting: Radiation Oncology

## 2015-07-19 DIAGNOSIS — Z510 Encounter for antineoplastic radiation therapy: Secondary | ICD-10-CM | POA: Diagnosis not present

## 2015-07-22 ENCOUNTER — Ambulatory Visit
Admission: RE | Admit: 2015-07-22 | Discharge: 2015-07-22 | Disposition: A | Discharge status: Home / Self Care | Payer: Medicare Other | Source: Ambulatory Visit | Attending: Radiation Oncology | Admitting: Radiation Oncology

## 2015-07-22 DIAGNOSIS — Z510 Encounter for antineoplastic radiation therapy: Secondary | ICD-10-CM | POA: Diagnosis not present

## 2015-07-23 ENCOUNTER — Ambulatory Visit
Admission: RE | Admit: 2015-07-23 | Discharge: 2015-07-23 | Disposition: A | Discharge status: Home / Self Care | Payer: Medicare Other | Source: Ambulatory Visit | Attending: Radiation Oncology | Admitting: Radiation Oncology

## 2015-07-23 ENCOUNTER — Encounter: Payer: Self-pay | Admitting: Radiation Oncology

## 2015-07-23 VITALS — BP 138/67 | HR 65 | Temp 98.2°F | Resp 20 | Wt 144.3 lb

## 2015-07-23 DIAGNOSIS — Z510 Encounter for antineoplastic radiation therapy: Secondary | ICD-10-CM | POA: Diagnosis not present

## 2015-07-23 DIAGNOSIS — C50511 Malignant neoplasm of lower-outer quadrant of right female breast: Secondary | ICD-10-CM

## 2015-07-23 NOTE — Progress Notes (Signed)
  Radiation Oncology         [phone] ________________________________  Name: Kathryn Sanchez MRN: [id number]  Date: 07/23/2015  DOB: [birth year]  Weekly Radiation Therapy Management    ICD-9-CM ICD-10-CM   1. Breast cancer of lower-outer quadrant of right female breast (HCC) 174.5 C50.511      Current Dose: 42.72 Gy     Planned Dose:  52.72 Gy  Narrative . . . . . . . . The patient presents for routine under treatment assessment.                                   weekly rad txs right breast, 16/21 completed, c/o itching and burning,but intact so far, starts her boost tomorrow, using radiaplex ,biafine , and hydrocortisone cream as well as aloe plant, which helps the most, appetite gone, Mild fatigue just started                                 Set-up films were reviewed.                                 The chart was checked. Physical Findings. . .  weight is 144 lb 4.8 oz (65.454 kg). Her oral temperature is 98.2 F (36.8 C). Her blood pressure is 138/67 and her pulse is 65. Her respiration is 20. .  bright erythema, dermatitis, the lungs are clear. The heart has regular rhythm and rate  Impression . . . . . . . The patient is tolerating radiation. Plan . . . . . . . . . . . . Continue treatment as planned.  ________________________________   Blair Promise, PhD, MD

## 2015-07-23 NOTE — Progress Notes (Signed)
weekly rad txs right breast, 16/21 completed, c/o itching and burning, bright erythema, dermatitis, under inframmary fold skin in thinning, but intact so far, starts her boost tomorrow, using radiaplex ,biafine , and hydrocortisone cream as [place]ell as aloe plant, which helps the most, appetite gone,  Mild fatigue just started BP 138/67 mmHg  Pulse 65  Temp(Src) 98.2 F (36.8 C) (Oral)  Resp 20  Wt 144 lb 4.8 oz (65.454 kg)  Wt Readings from Last 3 Encounters:  07/23/15 144 lb 4.8 oz (65.454 kg)  07/16/15 145 lb (65.772 kg)  07/09/15 145 lb 6.4 oz (65.953 kg)

## 2015-07-24 ENCOUNTER — Ambulatory Visit
Admission: RE | Admit: 2015-07-24 | Discharge: 2015-07-24 | Disposition: A | Discharge status: Home / Self Care | Payer: Medicare Other | Source: Ambulatory Visit | Attending: Radiation Oncology | Admitting: Radiation Oncology

## 2015-07-24 DIAGNOSIS — Z510 Encounter for antineoplastic radiation therapy: Secondary | ICD-10-CM | POA: Diagnosis not present

## 2015-07-25 ENCOUNTER — Ambulatory Visit
Admission: RE | Admit: 2015-07-25 | Discharge: 2015-07-25 | Disposition: A | Discharge status: Home / Self Care | Payer: Medicare Other | Source: Ambulatory Visit | Attending: Radiation Oncology | Admitting: Radiation Oncology

## 2015-07-25 DIAGNOSIS — Z510 Encounter for antineoplastic radiation therapy: Secondary | ICD-10-CM | POA: Diagnosis not present

## 2015-07-26 ENCOUNTER — Ambulatory Visit
Admission: RE | Admit: 2015-07-26 | Discharge: 2015-07-26 | Disposition: A | Discharge status: Home / Self Care | Payer: Medicare Other | Source: Ambulatory Visit | Attending: Radiation Oncology | Admitting: Radiation Oncology

## 2015-07-26 DIAGNOSIS — Z510 Encounter for antineoplastic radiation therapy: Secondary | ICD-10-CM | POA: Diagnosis not present

## 2015-07-29 ENCOUNTER — Ambulatory Visit
Admission: RE | Admit: 2015-07-29 | Discharge: 2015-07-29 | Disposition: A | Discharge status: Home / Self Care | Payer: Medicare Other | Source: Ambulatory Visit | Attending: Radiation Oncology | Admitting: Radiation Oncology

## 2015-07-29 DIAGNOSIS — Z510 Encounter for antineoplastic radiation therapy: Secondary | ICD-10-CM | POA: Diagnosis not present

## 2015-07-30 ENCOUNTER — Ambulatory Visit
Admission: RE | Admit: 2015-07-30 | Discharge: 2015-07-30 | Disposition: A | Discharge status: Home / Self Care | Payer: Medicare Other | Source: Ambulatory Visit | Attending: Radiation Oncology | Admitting: Radiation Oncology

## 2015-07-30 ENCOUNTER — Encounter: Payer: Self-pay | Admitting: Radiation Oncology

## 2015-07-30 ENCOUNTER — Other Ambulatory Visit: Payer: Self-pay | Admitting: *Deleted

## 2015-07-30 ENCOUNTER — Telehealth: Payer: Self-pay | Admitting: *Deleted

## 2015-07-30 VITALS — BP 139/74 | HR 71 | Temp 97.5°F | Ht 62.0 in | Wt 140.6 lb

## 2015-07-30 DIAGNOSIS — C50511 Malignant neoplasm of lower-outer quadrant of right female breast: Secondary | ICD-10-CM | POA: Diagnosis not present

## 2015-07-30 DIAGNOSIS — Z510 Encounter for antineoplastic radiation therapy: Secondary | ICD-10-CM | POA: Diagnosis not present

## 2015-07-30 MED ORDER — BIAFINE EX EMUL
Freq: Two times a day (BID) | CUTANEOUS | Status: DC
Start: 2015-07-30 — End: 2015-07-31
  Administered 2015-07-30: 15:00:00 via TOPICAL

## 2015-07-30 NOTE — Progress Notes (Signed)
  Radiation Oncology         [phone] ________________________________  Name: Kathryn Sanchez MRN: [id number]  Date: 07/30/2015  DOB: 1941-02-16  Weekly Radiation Therapy Management    ICD-9-CM ICD-10-CM   1. Breast cancer of lower-outer quadrant of right female breast (HCC) 174.5 C50.511 topical emolient (BIAFINE) emulsion     Current Dose: 52.72 Gy     Planned Dose:  52.72 Gy  Narrative . . . . . . . . The patient presents for routine under treatment assessment.                                   She is having a fair amount of itching and discomfort within the right breast area. She had difficulty sleeping last night in light of this issue. I recommended she try Tylenol Advil or Aleve for her discomfort. Also offered a mild narcotic she does not wish to try this. She's been using Biafine and radiaPlex as well as Benadryl for itching. The patient was given biafine today. She will also be given a gel pads to place along the breast area.                                 Set-up films were reviewed.                                 The chart was checked. Physical Findings. . .  height is 5\' 2"  (1.575 m) and weight is 140 lb 9.6 oz (63.776 kg). Her temperature is 97.5 F (36.4 C). Her blood pressure is 139/74 and her pulse is 71. . The lungs are clear. The heart has a regular rhythm and rate. Examination right breast reveals erythema and hyperpigmentation changes as well as some dry desquamation. No active moist desquamation at this time or drainage Impression . . . . . . . The patient is tolerating radiation. Plan . . . . . . . . . . . . Routine follow-up in one month or sooner if needed in light of skin irritation.  ________________________________   Blair Promise, PhD, MD

## 2015-07-30 NOTE — Telephone Encounter (Signed)
Left vm for pt to return call to congratulate on completion of xrt.

## 2015-07-30 NOTE — Progress Notes (Signed)
Ms. Credeur is here for her last fraction of radiation to her Right Breast. She admits to having fatigue, and is naps during the daytime to help. She does reports being unable to sleep last night. Her right breast is red, dry, scaling, with several open areas noted. There is no drainage present. The area is itchy, tender, with occasional sharp shooting pains. She is using the biafine, and radiaplex as directed and is using benadryl for itching as needed, but is not using any pain medicine at this time. She has asked for some more biafine cream today, which I will provide her with.  BP 139/74 mmHg  Pulse 71  Temp(Src) 97.5 F (36.4 C)  Ht 5\' 2"  (1.575 m)  Wt 140 lb 9.6 oz (63.776 kg)  BMI 25.71 kg/m2   Wt Readings from Last 3 Encounters:  07/30/15 140 lb 9.6 oz (63.776 kg)  07/23/15 144 lb 4.8 oz (65.454 kg)  07/16/15 145 lb (65.772 kg)

## 2015-07-31 ENCOUNTER — Telehealth: Payer: Self-pay | Admitting: Hematology and Oncology

## 2015-07-31 NOTE — Telephone Encounter (Signed)
Patient declined survivorship

## 2015-08-08 NOTE — Progress Notes (Signed)
  Radiation Oncology         (336) [phone] ________________________________  Name: RYA RAUSCH MRN: [id number]  Date: 07/30/2015  DOB: 1940-12-08  End of Treatment Note  ICD-9-CM ICD-10-CM     1. 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 Boost field was with a 4 field photon beam arrangement given the depth of the lumpectomy cavity within the breast.  Narrative: The patient tolerated radiation treatment relatively well.   She did experience some itching and discomfort within the breast area as well as fatigue. No moist desquamation at the completion of therapy.  Plan: The patient has completed radiation treatment. The patient will return to radiation oncology clinic for routine followup in one month. I advised them to call or return sooner if they have any questions or concerns related to their recovery or treatment.  -----------------------------------  Blair Promise, PhD, MD

## 2015-08-14 ENCOUNTER — Telehealth: Payer: Self-pay | Admitting: *Deleted

## 2015-08-14 NOTE — Telephone Encounter (Signed)
Patient called asking "when she is to start Anastrozole after radiation."  Advised she begin Sunday 08-25-2015.  Next scheduled F/U is 09-25-2015.  Plan per last visit is to "return to clinic 1 month after starting antiestrogen therapy which would be approximately mid November. So we will see her back in mid December."

## 2015-09-10 ENCOUNTER — Ambulatory Visit: Payer: Medicare Other | Admitting: Radiation Oncology

## 2015-09-12 ENCOUNTER — Ambulatory Visit: Payer: Self-pay | Admitting: Radiation Oncology

## 2015-09-24 NOTE — Assessment & Plan Note (Signed)
Right lumpectomy 05/21/15: IDC with calcifications 1.4 cm with DCIS, margins negative, 0/3 lymph node,ER 100%, PR 90%, HER-2 negative, Ki-67 13%, T1 cN0 stage IA Completed XRT 07/30/15  Treatment Plan: Antiestrogen therapy with anastrozole 1 mg daily 5 years   Bone density test done on 04/24/2015 showed T score of -1 normal bone density. RTC 3 months

## 2015-09-25 ENCOUNTER — Ambulatory Visit (HOSPITAL_BASED_OUTPATIENT_CLINIC_OR_DEPARTMENT_OTHER): Payer: Medicare Other | Admitting: Hematology and Oncology

## 2015-09-25 ENCOUNTER — Telehealth: Payer: Self-pay | Admitting: Hematology and Oncology

## 2015-09-25 ENCOUNTER — Encounter: Payer: Self-pay | Admitting: Hematology and Oncology

## 2015-09-25 VITALS — BP 149/67 | HR 67 | Temp 98.3°F | Resp 17 | Ht 62.0 in | Wt 145.2 lb

## 2015-09-25 DIAGNOSIS — Z170 Estrogen receptor positive status [ER+]: Secondary | ICD-10-CM

## 2015-09-25 DIAGNOSIS — Z79811 Long term (current) use of aromatase inhibitors: Secondary | ICD-10-CM

## 2015-09-25 DIAGNOSIS — C50511 Malignant neoplasm of lower-outer quadrant of right female breast: Secondary | ICD-10-CM | POA: Diagnosis present

## 2015-09-25 NOTE — Progress Notes (Signed)
Patient Care Team: Provider Not In System as PCP - General (Family Medicine) Autumn Messing III, MD as Consulting Physician (General Surgery) Nicholas Lose, MD as Consulting Physician (Hematology and Oncology) Gery Pray, MD as Consulting Physician (Radiation Oncology) Mauro Kaufmann, RN as Registered Nurse Rockwell Germany, RN as Registered Nurse Holley Bouche, NP as Nurse Practitioner (Nurse Practitioner)  DIAGNOSIS: Breast cancer of lower-outer quadrant of right female breast [facility])   Staging form: Breast, AJCC 7th Edition     Clinical stage from 05/01/2015: Stage IA (T1b, N0, M0) - Unsigned       Staging comments: Staged at breast conference on 7/.20.16  SUMMARY OF ONCOLOGIC HISTORY:   Breast cancer of lower-outer quadrant of right female breast ([place])   04/11/2015 Mammogram Right breast irregular mass with calcifications extend 5 mm anterior to the mass, mass itself is 7 mm at 6:00 position, axilla normal   04/24/2015 Initial Diagnosis Right breast biopsy 6:00: 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, 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 She finished radiation therapy may be a step down fairly well. She has 2 spots on her right breast which are bothering her with itching and discomfort. She wants to see a dermatologist to get them taken out. When she stuck anastrozole in the morning she was having nausea issues. She started  taking it at bedtime and this appears to be doing much better. Initially she had insomnia but that has resolved. She has been doing well over the past 2-3 weeks. She had an episode of atrial fibrillation recently and underwent cardioversion.  REVIEW OF SYSTEMS:   Constitutional: Denies fevers, chills or abnormal weight loss Eyes: Denies blurriness of vision Ears, nose, mouth, throat, and face: Denies mucositis or sore throat Respiratory: Denies cough, dyspnea or wheezes Cardiovascular: Denies palpitation, chest discomfort or lower extremity swelling Gastrointestinal:  Denies nausea, heartburn or change in bowel habits Skin: Denies abnormal skin rashes Lymphatics: Denies new lymphadenopathy or easy bruising Neurological:Denies numbness, tingling or new weaknesses Behavioral/Psych: Mood is stable, no new changes  Breast:  2 small skin lesions in the right breast skin All other systems were reviewed with the patient and are negative.  I have reviewed the past medical history, past surgical history, social history and family history with the patient and they are unchanged from previous note.  ALLERGIES:  is allergic to penicillins; clarithromycin; morphine and related; sulfa antibiotics; amoxil; cephalexin; ciprofloxacin; flexeril; and metronidazole.  MEDICATIONS:  Current Outpatient Prescriptions  Medication Sig Dispense Refill  . anastrozole (ARIMIDEX) 1 MG tablet Take 1 tablet (1 mg total) by mouth daily. (Patient not taking: Reported on 06/05/2015) 90 tablet 3  . buPROPion (WELLBUTRIN XL) 300 MG 24 hr tablet 1 po  daily    . busPIRone (BUSPAR) 15 MG tablet 1 by mouth daily    . diphenhydrAMINE (SOMINEX) 25 MG tablet Take 25 mg by mouth at bedtime as needed for sleep.    Marland Kitchen emollient (BIAFINE) cream Apply  topically 2 (two) times daily.    . non-metallic deodorant Jethro Poling) MISC Apply 1 application topically daily as needed.    Marland Kitchen omeprazole (PRILOSEC) 20 MG capsule Take by mouth.    Marland Kitchen  PARoxetine (PAXIL) 10 MG tablet 1 by mouth daily    . solifenacin (VESICARE) 10 MG tablet     . Vitamin D, Ergocalciferol, (DRISDOL) 50000 UNITS CAPS capsule Take 50,000 Units by mouth every 7 (seven) days. Monday    . warfarin (COUMADIN) 2.5 MG tablet Take 2.5 mg by mouth daily.    . Wound Cleansers (RADIAPLEX EX) Apply topically.     No current facility-administered medications for this visit.    PHYSICAL EXAMINATION: ECOG PERFORMANCE STATUS: 1 - Symptomatic but completely ambulatory  Filed Vitals:   09/25/15 1108  BP: 149/67  Pulse: 67  Temp: 98.3 F (36.8 C)  Resp: 17   Filed Weights   09/25/15 1108  Weight: 145 lb 3.2 oz (65.862 kg)    GENERAL:alert, no distress and comfortable SKIN: skin color, texture, turgor are normal, no rashes or significant lesions EYES: normal, Conjunctiva are pink and non-injected, sclera clear OROPHARYNX:no exudate, no erythema and lips, buccal mucosa, and tongue normal  NECK: supple, thyroid normal size, non-tender, without nodularity LYMPH:  no palpable lymphadenopathy in the cervical, axillary or inguinal LUNGS: clear to auscultation and percussion with normal breathing effort HEART: regular rate & rhythm and no murmurs and no lower extremity edema ABDOMEN:abdomen soft, non-tender and normal bowel sounds Musculoskeletal:no cyanosis of digits and no clubbing  NEURO: alert & oriented x 3 with fluent speech, no focal motor/sensory deficits LABORATORY DATA:  I have reviewed the data as listed   Chemistry      Component Value Date/Time   NA 140 05/01/2015 1248   K 3.9 05/01/2015 1248   CO2 25 05/01/2015 1248   BUN 16.1 05/01/2015 1248   CREATININE 1.0 05/01/2015 1248      Component Value Date/Time   CALCIUM 9.4 05/01/2015 1248   ALKPHOS 95 05/01/2015 1248   AST 31 05/01/2015 1248   ALT 29 05/01/2015 1248   BILITOT 0.45 05/01/2015 1248       Lab Results  Component Value Date   WBC 5.6 05/01/2015   HGB 13.9 05/01/2015   HCT 42.3  05/01/2015   MCV 97.5 05/01/2015   PLT 254 05/01/2015   NEUTROABS 3.1 05/01/2015   ASSESSMENT & PLAN:  Breast cancer of lower-outer quadrant of right female breast Right lumpectomy 05/21/15: IDC with calcifications 1.4 cm with DCIS, margins negative, 0/3 lymph node,ER 100%, PR 90%, HER-2 negative, Ki-67 13%, T1 cN0 stage IA Completed XRT 07/30/15 Anastrozole 1 mg daily 5 years started 08/26/2015  Anastrozole toxicities: 1. Nausea which resolved when she started taking it at bedtime He denies any hot flashes or myalgias.   Bone density test done on 04/24/2015 showed T score of -1 normal bone density.  Skin sores related to prior radiation therapy: She wants to see a dermatologist to take some of those out. They seem to be causing her itching sensation and intermittent pain.  RTC 6 months   No orders of the defined types were placed in this encounter.   The patient has a good understanding of the overall plan. she agrees with it. she will call with any problems that may develop before the next visit here.   Rulon Eisenmenger, MD 09/25/2015

## 2015-09-25 NOTE — Addendum Note (Signed)
Addended by: Prentiss Bells on: 09/25/2015 06:04 PM   Modules accepted: Orders, Medications

## 2015-09-25 NOTE — Telephone Encounter (Signed)
Gave patient avs report and appointment for June.  °

## 2015-09-27 ENCOUNTER — Encounter: Payer: Self-pay | Admitting: Nurse Practitioner

## 2015-09-27 DIAGNOSIS — C50511 Malignant neoplasm of lower-outer quadrant of right female breast: Secondary | ICD-10-CM

## 2015-09-27 NOTE — Progress Notes (Signed)
The Survivorship Care Plan was mailed to Kathryn Sanchez as she reported not being able to come in to the Survivorship Clinic for an in-person visit at this time. A letter was mailed to her outlining the purpose of the content of the care plan, as well as encouraging her to reach out to me with any questions or concerns.  My business card was included in the correspondence to the patient as well.    I will not be placing any follow-up appointments to the Survivorship Clinic for Kathryn Sanchez, but I am happy to see her at any time in the future for any survivorship concerns that may arise. Thank you for allowing me to participate in her care!  Kenn File, [place] [phone]

## 2016-03-12 ENCOUNTER — Telehealth: Payer: Self-pay | Admitting: Hematology and Oncology

## 2016-03-12 NOTE — Telephone Encounter (Signed)
pt called to cx 6/14 apt.. said she would call back to reschedule.

## 2016-03-25 ENCOUNTER — Ambulatory Visit: Payer: Medicare Other | Admitting: Hematology and Oncology

## 2016-03-30 ENCOUNTER — Other Ambulatory Visit: Payer: Self-pay | Admitting: Gastroenterology

## 2016-03-30 DIAGNOSIS — K219 Gastro-esophageal reflux disease without esophagitis: Secondary | ICD-10-CM

## 2016-03-30 DIAGNOSIS — K449 Diaphragmatic hernia without obstruction or gangrene: Secondary | ICD-10-CM

## 2016-04-03 ENCOUNTER — Other Ambulatory Visit: Payer: Medicare Other

## 2016-05-07 ENCOUNTER — Ambulatory Visit (HOSPITAL_BASED_OUTPATIENT_CLINIC_OR_DEPARTMENT_OTHER): Payer: Medicare Other | Admitting: Hematology and Oncology

## 2016-05-07 ENCOUNTER — Encounter: Payer: Self-pay | Admitting: Hematology and Oncology

## 2016-05-07 ENCOUNTER — Ambulatory Visit
Admission: RE | Admit: 2016-05-07 | Discharge: 2016-05-07 | Disposition: A | Discharge status: Home / Self Care | Payer: Medicare Other | Source: Ambulatory Visit | Attending: Gastroenterology | Admitting: Gastroenterology

## 2016-05-07 ENCOUNTER — Telehealth: Payer: Self-pay | Admitting: Hematology and Oncology

## 2016-05-07 ENCOUNTER — Other Ambulatory Visit: Payer: Medicare Other

## 2016-05-07 DIAGNOSIS — C50511 Malignant neoplasm of lower-outer quadrant of right female breast: Secondary | ICD-10-CM | POA: Diagnosis present

## 2016-05-07 DIAGNOSIS — K219 Gastro-esophageal reflux disease without esophagitis: Secondary | ICD-10-CM

## 2016-05-07 DIAGNOSIS — K449 Diaphragmatic hernia without obstruction or gangrene: Secondary | ICD-10-CM

## 2016-05-07 DIAGNOSIS — Z79811 Long term (current) use of aromatase inhibitors: Secondary | ICD-10-CM | POA: Diagnosis not present

## 2016-05-07 NOTE — Assessment & Plan Note (Signed)
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 Nausea which resolved when she started taking it at bedtime He denies any hot flashes or myalgias.   Bone density test done on 04/24/2015 showed T score of -1 normal bone density.  Breast Cancer Surveillance: 1. Breast exam 05/07/2016: Benign 2. Mammogram No abnormalities. Postsurgical changes. Breast Density Category . I recommended that she get 3-D mammograms for surveillance. Discussed the differences between different breast density categories.    Skin sores related to prior radiation therapy: She wants to see a dermatologist to take some of those out. They seem to be causing her itching sensation and intermittent pain.  RTC 6 months

## 2016-05-07 NOTE — Telephone Encounter (Signed)
appt made and avs printed °

## 2016-05-07 NOTE — Progress Notes (Signed)
Patient Care Team: Provider Not In System as PCP - General (Family Medicine) Autumn Messing III, MD as Consulting Physician (General Surgery) Nicholas Lose, MD as Consulting Physician (Hematology and Oncology) Gery Pray, MD as Consulting Physician (Radiation Oncology) Mauro Kaufmann, RN as Registered Nurse Rockwell Germany, RN as Registered Nurse Holley Bouche, NP as Nurse Practitioner (Nurse Practitioner) Sylvan Cheese, NP as Nurse Practitioner (Hematology and Oncology)  DIAGNOSIS: Breast cancer of lower-outer quadrant of right female breast [facility])   Staging form: Breast, AJCC 7th Edition   - Clinical stage from 05/01/2015: Stage IA (T1b, N0, M0) - Unsigned         Staging comments: Staged at breast conference on 7/.20.16   - Pathologic stage from 05/21/2015: Stage IA (T1c, N0, cM0) - Unsigned  SUMMARY OF ONCOLOGIC HISTORY:   Breast cancer of lower-outer quadrant of right female breast ([place])   04/11/2015 Mammogram    Right breast irregular mass with calcifications extend 5 mm anterior to the mass, mass itself is 7 mm at 6:00 position, axilla normal     04/24/2015 Initial Biopsy    Right breast biopsy 6:00: 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, 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 Planned duration of therapy 5 years     09/27/2015 Survivorship    Survivorship care plan completed and mailed to  patient in lieu of in person visit      CHIEF COMPLIANT: Follow-up on anastrozole  INTERVAL HISTORY: Kathryn Sanchez is a 75 year old with above-mentioned history of right breast cancer currently anastrozole adjuvant therapy and she appears to be tolerating extremely well. She denies any lumps or nodules in she has an episode of hot flashes every time 30 minutes after taking the masses old but nothing further. Denies any myalgias. Denies any lumps or nodules in the breasts.  REVIEW OF SYSTEMS:   Constitutional: Denies fevers, chills or abnormal weight loss Eyes: Denies blurriness of vision Ears, nose, mouth, throat, and face: Denies mucositis or sore throat Respiratory: Denies cough, dyspnea or wheezes Cardiovascular: Denies palpitation, chest discomfort Gastrointestinal:  Denies nausea, heartburn or change in bowel habits Skin: Denies abnormal skin rashes Lymphatics: Denies new lymphadenopathy or easy bruising Neurological:Denies numbness, tingling or new weaknesses Behavioral/Psych: Mood is stable, no new changes  Extremities: No lower extremity edema Breast:  denies any pain or lumps or nodules in either breasts All other systems were reviewed with the patient and are negative.  I have reviewed the past medical history, past surgical history, social history and family history with the patient and they are unchanged from previous note.  ALLERGIES:  is allergic to penicillins; clarithromycin; morphine and related; sulfa antibiotics; amoxil [amoxicillin]; cephalexin; ciprofloxacin; flexeril [cyclobenzaprine]; and metronidazole.  MEDICATIONS:  Current Outpatient Prescriptions  Medication Sig Dispense Refill  . amiodarone (PACERONE)  200 MG tablet Take by mouth.    Marland Kitchen anastrozole (ARIMIDEX) 1 MG tablet Take 1 tablet (1 mg total) by mouth daily. 90 tablet 3  . buPROPion (WELLBUTRIN XL) 300 MG 24 hr tablet 1 po  daily    . busPIRone (BUSPAR) 15 MG tablet 1 by mouth daily    . CARTIA XT  120 MG 24 hr capsule     . cycloSPORINE (RESTASIS) 0.05 % ophthalmic emulsion 2gtts daily    . diphenhydrAMINE (SOMINEX) 25 MG tablet Take 25 mg by mouth at bedtime as needed for sleep.    Marland Kitchen estradiol (VIVELLE-DOT) 0.05 MG/24HR patch     . omeprazole (PRILOSEC) 20 MG capsule Take by mouth.    Marland Kitchen PARoxetine (PAXIL) 10 MG tablet 1 by mouth daily    . prednisoLONE acetate (PRED FORTE) 1 % ophthalmic suspension     . rivaroxaban (XARELTO) 20 MG TABS tablet     . solifenacin (VESICARE) 10 MG tablet     . traMADol (ULTRAM) 50 MG tablet     . Vitamin D, Ergocalciferol, (DRISDOL) 50000 UNITS CAPS capsule Take 50,000 Units by mouth every 7 (seven) days. Monday    . warfarin (COUMADIN) 2.5 MG tablet Take 2.5 mg by mouth daily.     No current facility-administered medications for this visit.     PHYSICAL EXAMINATION: ECOG PERFORMANCE STATUS: 1 - Symptomatic but completely ambulatory  Vitals:   05/07/16 1018  BP: 102/87  Pulse: 64  Resp: 17  Temp: 98.3 F (36.8 C)   Filed Weights   05/07/16 1018  Weight: 136 lb 12.8 oz (62.1 kg)    GENERAL:alert, no distress and comfortable SKIN: skin color, texture, turgor are normal, no rashes or significant lesions EYES: normal, Conjunctiva are pink and non-injected, sclera clear OROPHARYNX:no exudate, no erythema and lips, buccal mucosa, and tongue normal  NECK: supple, thyroid normal size, non-tender, without nodularity LYMPH:  no palpable lymphadenopathy in the cervical, axillary or inguinal LUNGS: clear to auscultation and percussion with normal breathing effort HEART: regular rate & rhythm and no murmurs and no lower extremity edema ABDOMEN:abdomen soft, non-tender and normal bowel sounds MUSCULOSKELETAL:no cyanosis of digits and no clubbing  NEURO: alert & oriented x 3 with fluent speech, no focal motor/sensory deficits EXTREMITIES: No lower extremity edema BREAST: No palpable masses or nodules in either right or left breasts. No palpable  axillary supraclavicular or infraclavicular adenopathy no breast tenderness or nipple discharge. (exam performed in the presence of a chaperone)  LABORATORY DATA:  I have reviewed the data as listed   Chemistry      Component Value Date/Time   NA 140 05/01/2015 1248   K 3.9 05/01/2015 1248   CO2 25 05/01/2015 1248   BUN 16.1 05/01/2015 1248   CREATININE 1.0 05/01/2015 1248      Component Value Date/Time   CALCIUM 9.4 05/01/2015 1248   ALKPHOS 95 05/01/2015 1248   AST 31 05/01/2015 1248   ALT 29 05/01/2015 1248   BILITOT 0.45 05/01/2015 1248       Lab Results  Component Value Date   WBC 5.6 05/01/2015   HGB 13.9 05/01/2015   HCT 42.3 05/01/2015   MCV 97.5 05/01/2015   PLT 254 05/01/2015   NEUTROABS 3.1 05/01/2015     ASSESSMENT & PLAN:  Breast cancer of lower-outer quadrant of right female breast Right lumpectomy 05/21/15: IDC with calcifications 1.4 cm with DCIS, margins negative, 0/3 lymph node,ER 100%, PR 90%, HER-2 negative, Ki-67 13%, T1  cN0 stage IA Completed XRT 07/30/15 Anastrozole 1 mg daily 5 years started 08/26/2015  Anastrozole toxicities: 1. Nausea which resolved when she started taking it at bedtime He denies any hot flashes or myalgias.   Bone density test done on 04/24/2015 showed T score of -1 normal bone density.  Breast Cancer Surveillance: 1. Breast exam 05/07/2016: Benign 2. Mammogram Being done next month Discussed the differences between different breast density categories.    Skin sores related to prior radiation therapy: She wants to see a dermatologist to take some of those out. They seem to be causing her itching sensation and intermittent pain.  RTC 1 year   No orders of the defined types were placed in this encounter.  The patient has a good understanding of the overall plan. she agrees with it. she will call with any problems that may develop before the next visit here.   Rulon Eisenmenger, MD 05/07/16

## 2016-06-18 ENCOUNTER — Telehealth: Payer: Self-pay | Admitting: *Deleted

## 2016-06-18 DIAGNOSIS — C50511 Malignant neoplasm of lower-outer quadrant of right female breast: Secondary | ICD-10-CM

## 2016-06-18 MED ORDER — ANASTROZOLE 1 MG PO TABS: 1.0000 mg | ORAL_TABLET | Freq: Every day | ORAL | 3 refills | Status: DC

## 2016-06-18 NOTE — Telephone Encounter (Signed)
Pharmacist called requesting refill on behalf of this patient.  Refill authorized per [facility] protocol.

## 2016-09-19 ENCOUNTER — Other Ambulatory Visit: Payer: Self-pay | Admitting: Hematology and Oncology

## 2016-09-19 DIAGNOSIS — C50511 Malignant neoplasm of lower-outer quadrant of right female breast: Secondary | ICD-10-CM

## 2016-10-22 ENCOUNTER — Ambulatory Visit (INDEPENDENT_AMBULATORY_CARE_PROVIDER_SITE_OTHER): Payer: Medicare Other

## 2016-10-22 ENCOUNTER — Ambulatory Visit (INDEPENDENT_AMBULATORY_CARE_PROVIDER_SITE_OTHER): Payer: Medicare Other | Admitting: Orthopaedic Surgery

## 2016-10-22 ENCOUNTER — Encounter (INDEPENDENT_AMBULATORY_CARE_PROVIDER_SITE_OTHER): Payer: Self-pay | Admitting: Orthopaedic Surgery

## 2016-10-22 VITALS — BP 127/54 | HR 68 | Resp 15 | Ht 62.0 in | Wt 135.0 lb

## 2016-10-22 DIAGNOSIS — G8929 Other chronic pain: Secondary | ICD-10-CM

## 2016-10-22 DIAGNOSIS — M65812 Other synovitis and tenosynovitis, left shoulder: Secondary | ICD-10-CM

## 2016-10-22 DIAGNOSIS — M25522 Pain in left elbow: Secondary | ICD-10-CM | POA: Diagnosis not present

## 2016-10-22 DIAGNOSIS — M25512 Pain in left shoulder: Secondary | ICD-10-CM

## 2016-10-22 DIAGNOSIS — M7552 Bursitis of left shoulder: Secondary | ICD-10-CM

## 2016-10-22 MED ORDER — LIDOCAINE HCL 1 % IJ SOLN
2.0000 mL | INTRAMUSCULAR | Status: AC | PRN
  Administered 2016-10-22: 2 mL

## 2016-10-22 MED ORDER — BUPIVACAINE HCL 0.5 % IJ SOLN
2.0000 mL | INTRAMUSCULAR | Status: AC | PRN
  Administered 2016-10-22: 2 mL via INTRA_ARTICULAR

## 2016-10-22 MED ORDER — METHYLPREDNISOLONE ACETATE 40 MG/ML IJ SUSP
80.0000 mg | INTRAMUSCULAR | Status: AC | PRN
  Administered 2016-10-22: 80 mg

## 2016-10-22 NOTE — Progress Notes (Signed)
Office Visit Note   Patient: Kathryn Sanchez           Date of Birth: 1940/11/05           MRN: [id number] Visit Date: 10/22/2016              Requested by: No referring provider defined for this encounter. PCP: PROVIDER NOT IN SYSTEM   Assessment & Plan: Visit Diagnoses:  1. Bursitis of left shoulder   2. Chronic left shoulder pain   3. Synovitis of left shoulder   4. Pain in left elbow     Plan: #1: Corticosteroid injection into the articular and subacromially. Good relief after injection. #2: This is not beneficial she can give [place] a call and we'll schedule her for an MRI scan of her left shoulder.  Follow-Up Instructions: Return if symptoms worsen or fail to improve.   Orders:  Orders Placed This Encounter  Procedures  . Large Joint Injection/Arthrocentesis  . XR Elbow 2 Views Left  . XR Shoulder Left   No orders of the defined types were placed in this encounter.     Procedures: Large Joint Inj Date/Time: 10/22/2016 3:47 PM Performed by: Biagio Borg D Authorized by: Biagio Borg D   Consent Given by:  Patient Timeout: prior to procedure the correct patient, procedure, and site was verified   Indications:  Pain Location:  Shoulder Site:  L glenohumeral Prep: patient was prepped and draped in usual sterile fashion   Needle Size:  25 G Needle Length:  1.5 inches Approach:  Posterior Ultrasound Guidance: No   Fluoroscopic Guidance: No   Arthrogram: No   Medications:  2 mL lidocaine 1 %; 2 mL bupivacaine 0.5 %; 80 mg methylPREDNISolone acetate 40 MG/ML Aspiration Attempted: No   Patient tolerance:  Patient tolerated the procedure well with no immediate complications  Injection was placed in the subacromial space and intra-articularly with excellent results and marked decrease in pain     Clinical Data: No additional findings.   Subjective: Chief Complaint  Patient presents with  . Left Shoulder - Pain  . Left Elbow - Pain    Kathryn Sanchez is a  very pleasant 76 year old white female who is seen today for evaluation of her left shoulder. She has been treated by her oral practitioner in [place] with physical therapy. This started back in October of last year. She can lift the arm with a severe pain when she brings it back into its original position. She does use tramadol intermittently which can be somewhat helpful. She's not having difficulty with sleeping. She also has difficulty getting to her belt. Seen today for evaluation.      Review of Systems  Constitutional:       History of breast cancer  HENT: Negative.   Respiratory: Negative.   Cardiovascular: Negative.   Gastrointestinal: Negative.        History of irritable bowel syndrome  Genitourinary: Negative.   Skin: Negative.   Neurological: Negative.   Hematological: Negative.   Psychiatric/Behavioral: Negative.        History of depression     Objective: Vital Signs: BP (!) 127/54 (BP Location: Left Arm, Patient Position: Sitting, Cuff Size: Normal)   Pulse 68   Resp 15   Ht 5\' 2"  (1.575 m)   Wt 135 lb (61.2 kg)   BMI 24.69 kg/m   Physical Exam  Constitutional: She is oriented to person, place, and time. She appears well-developed and well-nourished.  HENT:  Head: Normocephalic and atraumatic.  Eyes: EOM are normal. Pupils are equal, round, and reactive to light.  Pulmonary/Chest: Effort normal.  Neurological: She is alert and oriented to person, place, and time.  Skin: Skin is warm and dry.  Psychiatric: She has a normal mood and affect. Her behavior is normal. Judgment and thought content normal.    Left Shoulder Exam   Tenderness  The patient is experiencing tenderness in the acromioclavicular joint and biceps tendon.  Range of Motion  Active Abduction: 70  Passive Abduction: 90  Forward Flexion: 120  External Rotation: 80  Internal Rotation 90 degrees: 60   Muscle Strength  Abduction: 3/5  Internal Rotation: 3/5  External Rotation: 3/5    Supraspinatus: 3/5   Tests  Impingement: positive  Other  Sensation: normal Pulse: present   Comments:  No referred pain from the cervical spine. She actually has about 70-80 of right left rotation backward extension to around 60 and forward flexion chin to the chest      Specialty Comments:  No specialty comments available.  Imaging: Xr Elbow 2 Views Left  Result Date: 10/22/2016 Three-view of the left elbow reveals no pathology or fractures. She may have a slight increase in the anterior fat pad but nothing posteriorly. This was reviewed by Dr. Durward Fortes also.  Xr Shoulder Left  Result Date: 10/22/2016 Two-view x-ray of the left shoulder reveals some degenerative changes at the before meals joint. She does have an inferior spur at the inferior neck of the humerus. He does have some osteopenia. There is a type II acromion.    PMFS History: Patient Active Problem List   Diagnosis Date Noted  . Breast cancer of lower-outer quadrant of right female breast ([place]) 04/30/2015   Past Medical History:  Diagnosis Date  . Arthritis    neck and hands  . Breast cancer ([place]) 04/2015   right  . Dental crown present    left upper front tooth  . Depression   . GERD (gastroesophageal reflux disease)   . History of atrial fibrillation   . IBS (irritable bowel syndrome)   . Rash 05/16/2015   legs  . Seasonal allergies   . Urinary incontinence     Family History  Problem Relation Age of Onset  . Heart disease Father     Past Surgical History:  Procedure Laterality Date  . ABDOMINAL HYSTERECTOMY     complete  . APPENDECTOMY    . BLADDER SURGERY    . CARDIOVERSION     x 2  . CATARACT EXTRACTION W/ INTRAOCULAR LENS IMPLANT Bilateral   . LAMINECTOMY AND MICRODISCECTOMY LUMBAR SPINE Right 12/21/2002   L4-5 laminotomy, foraminotomy, microdissection  . RADIOACTIVE SEED GUIDED MASTECTOMY WITH AXILLARY SENTINEL LYMPH NODE BIOPSY Right 05/21/2015   Procedure: RADIOACTIVE SEED  GUIDED RIGHT PARTIAL MASTECTOMY WITH AXILLARY SENTINEL LYMPH NODE BIOPSY;  Surgeon: Autumn Messing III, MD;  Location: [place];  Service: General;  Laterality: Right;  . TENNIS ELBOW RELEASE/NIRSCHEL PROCEDURE Right   . TONSILLECTOMY     age 65   Social History   Occupational History  . Not on file.   Social History Main Topics  . Smoking status: Former Smoker    Packs/day: 0.50    Years: 4.00    Types: Cigarettes    Quit date: 10/11/1965  . Smokeless tobacco: Never Used  . Alcohol use 1.8 oz/week    3 Glasses of wine per week  . Drug use: No  . Sexual  activity: Not on file

## 2016-11-04 ENCOUNTER — Telehealth (INDEPENDENT_AMBULATORY_CARE_PROVIDER_SITE_OTHER): Payer: Self-pay | Admitting: Orthopaedic Surgery

## 2016-11-04 ENCOUNTER — Other Ambulatory Visit (INDEPENDENT_AMBULATORY_CARE_PROVIDER_SITE_OTHER): Payer: Self-pay

## 2016-11-04 DIAGNOSIS — G8929 Other chronic pain: Secondary | ICD-10-CM

## 2016-11-04 DIAGNOSIS — M25512 Pain in left shoulder: Principal | ICD-10-CM

## 2016-11-04 NOTE — Telephone Encounter (Signed)
Patient states the inj in her shoulder lasted for 2 days in which she was pain free. She can lift her arm above her head fine but other movements are very painful. Patient is under the impression that an MRI is the next step.

## 2016-11-04 NOTE — Telephone Encounter (Signed)
Sent referral for L shoulder MRI 11/04/16.

## 2016-11-11 ENCOUNTER — Ambulatory Visit
Admission: RE | Admit: 2016-11-11 | Discharge: 2016-11-11 | Disposition: A | Discharge status: Home / Self Care | Payer: Medicare Other | Source: Ambulatory Visit | Attending: Orthopaedic Surgery | Admitting: Orthopaedic Surgery

## 2016-11-11 DIAGNOSIS — G8929 Other chronic pain: Secondary | ICD-10-CM

## 2016-11-11 DIAGNOSIS — M25512 Pain in left shoulder: Principal | ICD-10-CM

## 2016-11-12 ENCOUNTER — Other Ambulatory Visit: Payer: Medicare Other

## 2016-12-09 ENCOUNTER — Ambulatory Visit (INDEPENDENT_AMBULATORY_CARE_PROVIDER_SITE_OTHER): Payer: Medicare Other | Admitting: Orthopaedic Surgery

## 2016-12-21 ENCOUNTER — Ambulatory Visit (INDEPENDENT_AMBULATORY_CARE_PROVIDER_SITE_OTHER): Payer: Medicare Other | Admitting: Orthopaedic Surgery

## 2016-12-21 ENCOUNTER — Other Ambulatory Visit: Payer: Self-pay | Admitting: Hematology and Oncology

## 2016-12-21 DIAGNOSIS — C50511 Malignant neoplasm of lower-outer quadrant of right female breast: Secondary | ICD-10-CM

## 2016-12-28 ENCOUNTER — Encounter (INDEPENDENT_AMBULATORY_CARE_PROVIDER_SITE_OTHER): Payer: Self-pay | Admitting: Orthopaedic Surgery

## 2016-12-28 ENCOUNTER — Ambulatory Visit (INDEPENDENT_AMBULATORY_CARE_PROVIDER_SITE_OTHER): Payer: Medicare Other | Admitting: Orthopaedic Surgery

## 2016-12-28 VITALS — BP 135/68 | HR 64 | Resp 14 | Ht 62.0 in | Wt 135.0 lb

## 2016-12-28 DIAGNOSIS — G8929 Other chronic pain: Secondary | ICD-10-CM | POA: Diagnosis not present

## 2016-12-28 DIAGNOSIS — M25512 Pain in left shoulder: Secondary | ICD-10-CM

## 2016-12-28 NOTE — Progress Notes (Signed)
Office Visit Note   Patient: Kathryn Sanchez           Date of Birth: 1941-09-25           MRN: [id number] Visit Date: 12/28/2016              Requested by: No referring provider defined for this encounter. 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 Moderate degenerative glenohumeral chondral thinning   Plan: Course of physical therapy at [facility], follow-up In 1-2 months if no improvement Follow-Up Instructions: No Follow-up on file.   Orders:  No orders of the defined types were placed in this encounter.  No orders of the defined types were placed in this encounter.     Procedures: No procedures performed RI scan left shoulder performed 11/11/2016. Demonstrates mild supraspinatus tendinopathy with mild partial-thickness articular surface insertional tearing. Moderate tendinopathy of the intra-articular segment of biceps long head. Mild to moderate subcortical marrow edema and spurring of the acromioclavicular joint and moderate degenerative glenohumeral chondral thinning.  Clinical Data: No additional findings.   Subjective: No chief complaint on file.   Ms. Kocian is a 76 year old female here today for her MRI results of Left shoulder.  Recently had atrial fibrillation ablation in [place], [place]. Still having some trouble with her left shoulder but being careful with certain activities to avoid pain.  Review of Systems   Objective: Vital Signs: There were no vitals taken for this visit.  Physical Exam  Ortho Exam left shoulder with normal biceps. Some tenderness in the lateral subacromial region. Positive impingement on the extremes of internal/external rotation. Minimally positive empty can test good. Good strength with internal/external rotation. No popping or clicking. No acromial clavicular joint pain. Good grip and good  release distally. No loss of motion. No pain with motion of cervical spine  Specialty Comments:  No specialty comments available.  Imaging: No results found.   PMFS History: Patient Active Problem List   Diagnosis Date Noted  . Breast cancer of lower-outer quadrant of right female breast ([place]) 04/30/2015   Past Medical History:  Diagnosis Date  . Arthritis    neck and hands  . Breast cancer ([place]) 04/2015   right  . Dental crown present    left upper front tooth  . Depression   . GERD (gastroesophageal reflux disease)   . History of atrial fibrillation   . IBS (irritable bowel syndrome)   . Rash 05/16/2015   legs  . Seasonal allergies   . Urinary incontinence     Family History  Problem Relation Age of Onset  . Heart disease Father     Past Surgical History:  Procedure Laterality Date  . ABDOMINAL HYSTERECTOMY     complete  . APPENDECTOMY    . BLADDER SURGERY    . CARDIOVERSION     x 2  . CATARACT EXTRACTION W/ INTRAOCULAR LENS IMPLANT Bilateral   . LAMINECTOMY AND MICRODISCECTOMY LUMBAR SPINE Right 12/21/2002   L4-5 laminotomy, foraminotomy, microdissection  . RADIOACTIVE SEED GUIDED MASTECTOMY WITH AXILLARY SENTINEL LYMPH NODE BIOPSY Right 05/21/2015   Procedure: RADIOACTIVE SEED GUIDED RIGHT PARTIAL MASTECTOMY WITH AXILLARY SENTINEL LYMPH NODE BIOPSY;  Surgeon: Autumn Messing III, MD;  Location: [facility];  Service: General;  Laterality: Right;  . TENNIS ELBOW RELEASE/NIRSCHEL PROCEDURE Right   . TONSILLECTOMY     age 13   Social  History   Occupational History  . Not on file.   Social History Main Topics  . Smoking status: Former Smoker    Packs/day: 0.50    Years: 4.00    Types: Cigarettes    Quit date: 10/11/1965  . Smokeless tobacco: Never Used  . Alcohol use 1.8 oz/week    3 Glasses of wine per week  . Drug use: No  . Sexual activity: Not on file

## 2017-03-02 ENCOUNTER — Other Ambulatory Visit: Payer: Self-pay | Admitting: Hematology and Oncology

## 2017-03-02 DIAGNOSIS — C50511 Malignant neoplasm of lower-outer quadrant of right female breast: Secondary | ICD-10-CM

## 2017-03-12 DEATH — deceased

## 2017-05-04 NOTE — Assessment & Plan Note (Deleted)
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 Nausea which resolved when she started taking it at bedtime He denies any hot flashes or myalgias.   Bone density test done on 04/24/2015 showed T score of -1 normal bone density.  Breast Cancer Surveillance: 1. Breast exam 05/07/2016: Benign 2. Mammogram Being done next month Discussed the differences between different breast density categories.    Skin sores related to prior radiation therapy: She wants to see a dermatologist to take some of those out. They seem to be causing her itching sensation and intermittent pain.  RTC 1 year

## 2017-05-05 ENCOUNTER — Ambulatory Visit: Payer: Medicare Other | Admitting: Hematology and Oncology

## 2017-07-04 IMAGING — MR MR SHOULDER*L* W/O CM
5 series · 32 of 40 positions shown · non-contrast
Comparison: Radiographs from 10/22/2016

CLINICAL DATA: Left shoulder pain.

EXAM:
MRI OF THE LEFT SHOULDER WITHOUT CONTRAST
TECHNIQUE: Multiplanar, multisequence MR imaging of the shoulder was performed.
No intravenous contrast was administered.

[Series 3: T2 fat-sat · axial · 4.0mm · 0.55mm/px · z∈[-27,+73]mm · 8 of 23 slices shown (1 of 3)]
[im 1/23]
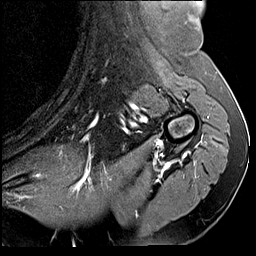
[im 3/23]
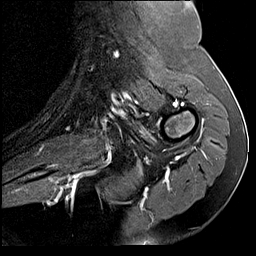
[im 8/23]
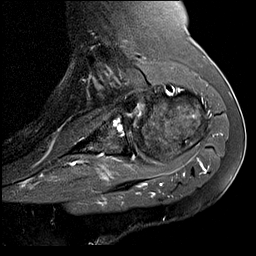
[im 10/23]
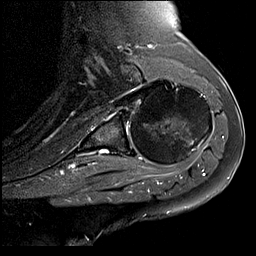
[im 13/23]
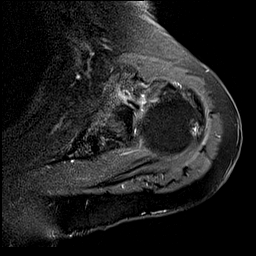
[im 15/23]
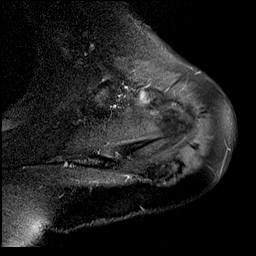
[im 20/23]
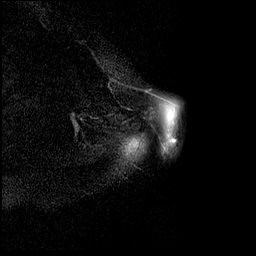
[im 23/23]
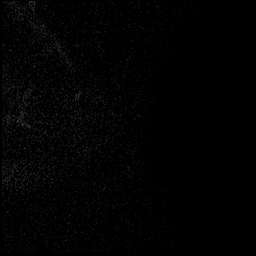

[Series 4: T2 fat-sat · oblique · 4.0mm · 0.55mm/px · 8 of 18 slices shown (2 of 3)]
[im 1/18]
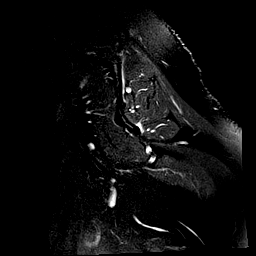
[im 3/18]
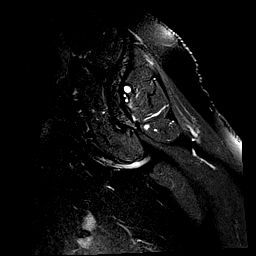
[im 5/18]
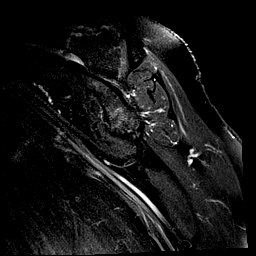
[im 8/18]
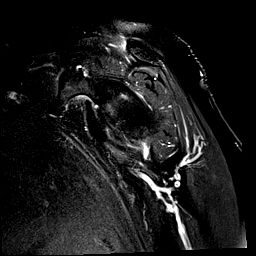
[im 10/18]
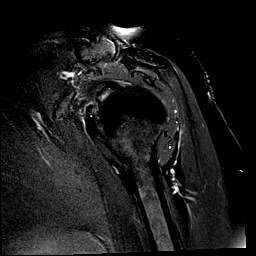
[im 13/18]
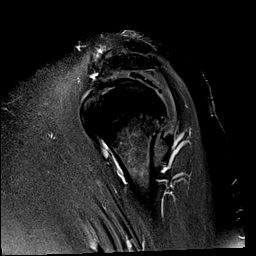
[im 15/18]
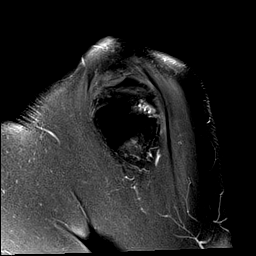
[im 18/18]
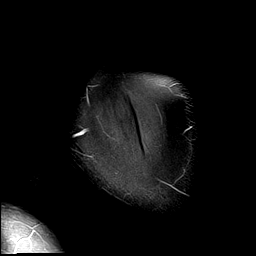

[Series 5: T1 · oblique · 4.0mm · 0.22mm/px · 2 of 18 slices shown]
[im 1/18]
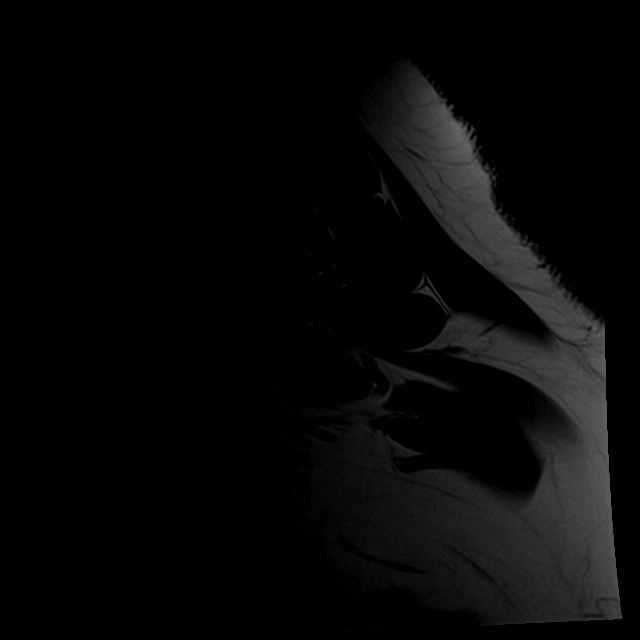
[im 3/18]
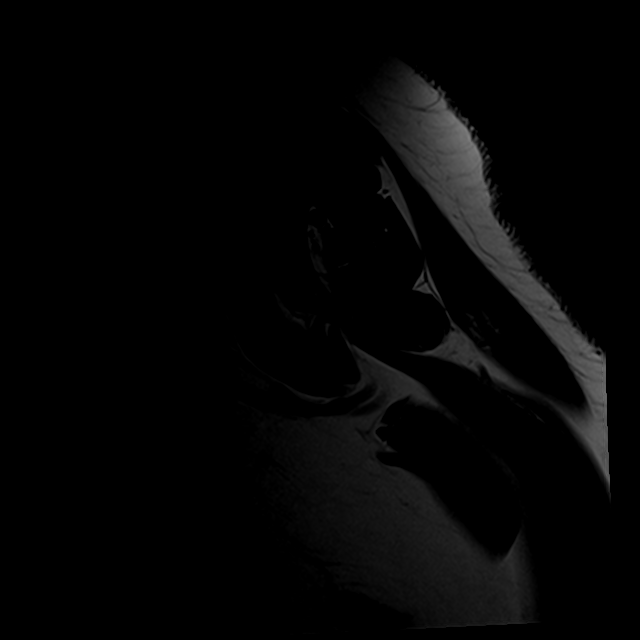

[Series 6: T2 fat-sat · oblique · 4.0mm · 0.27mm/px · 7 of 17 slices shown (3 of 3)]
[im 1/17]
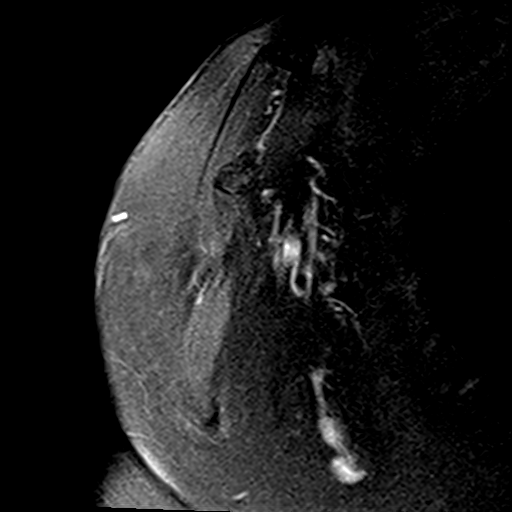
[im 3/17]
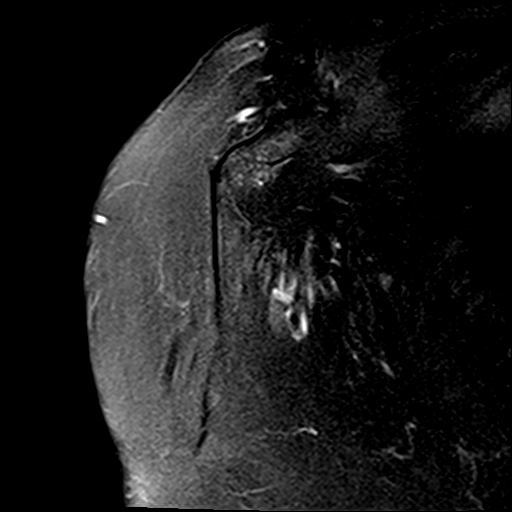
[im 6/17]
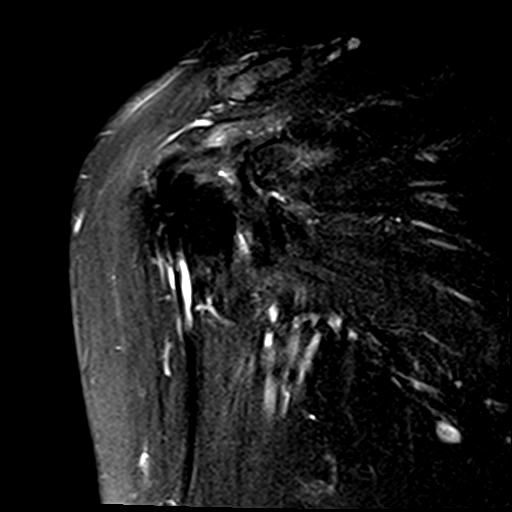
[im 9/17]
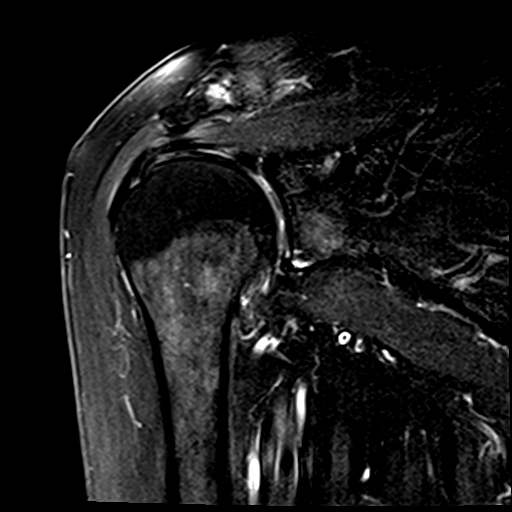
[im 11/17]
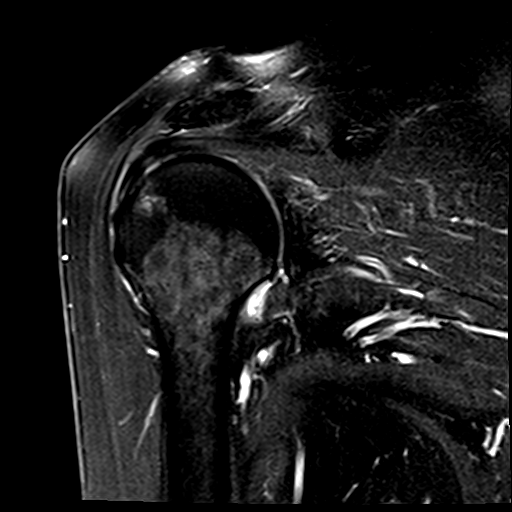
[im 14/17]
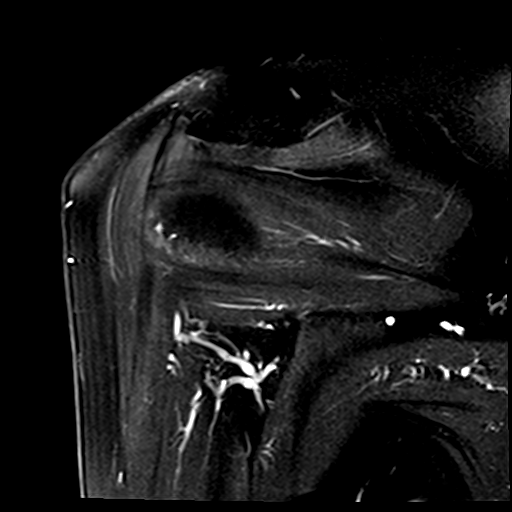
[im 17/17]
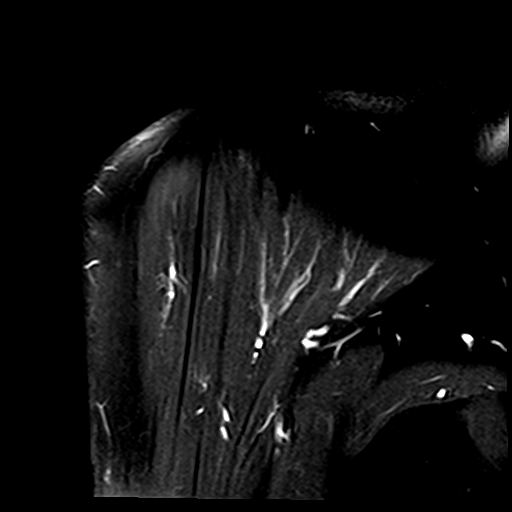

[Series 7: PD · oblique · 4.0mm · 0.44mm/px · 7 of 17 slices shown]
[im 1/17]
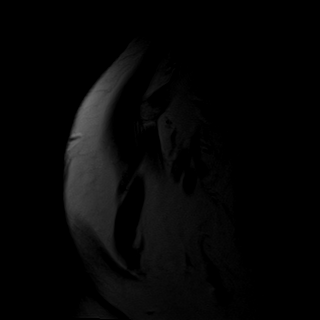
[im 3/17]
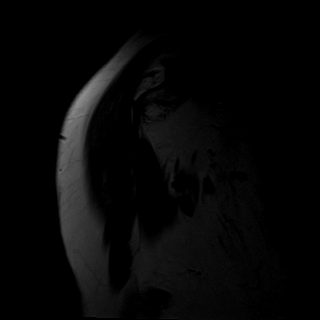
[im 6/17]
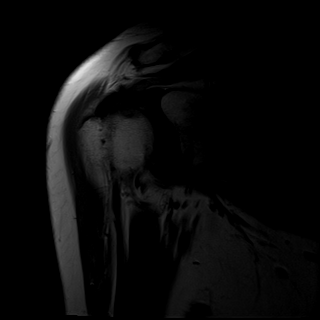
[im 9/17]
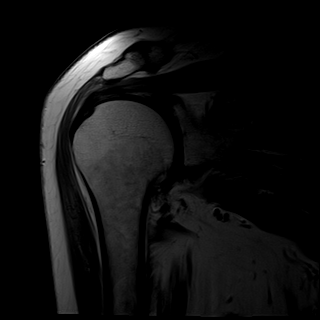
[im 11/17]
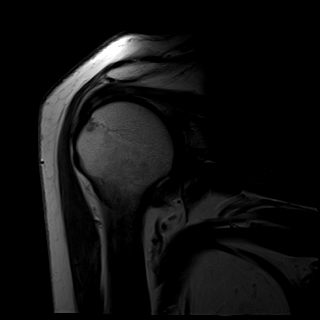
[im 14/17]
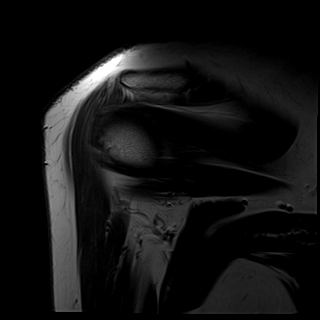
[im 17/17]
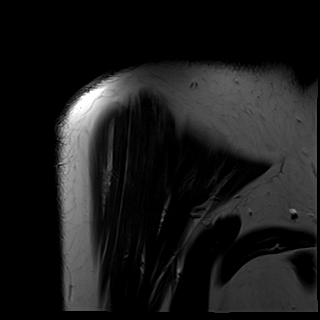

[32 of 40 positions shown; findings below may reference images not displayed]

FINDINGS: Rotator cuff: Mild supraspinatus tendinopathy with suspected mild
partial thickness articular surface insertional tearing in the
supraspinatus on image [DATE]. No full-thickness tear observed.

Muscles:  Unremarkable

Biceps long head: Moderate tendinopathy of the intra-articular
segment.

Acromioclavicular Joint: Mild to moderate subcortical marrow edema
and mild spurring. Type I acromion. Anterolaterally downsloping
acromion may predispose to impingement.

Glenohumeral Joint: Suspected mild synovitis in the rotator
interval. No joint effusion. Degenerative subcortical cystic lesions
along the anterior inferior glenoid. Moderate degenerative
glenohumeral chondral thinning.

Labrum: Likely degenerative signal in the superior labrum, a
definite tear is not seen.

Bones: No significant extra-articular osseous abnormalities
identified.

Other: No supplemental non-categorized findings.
IMPRESSION: 1. Mild supraspinatus tendinopathy with a mild partial thickness
articular surface insertional tear in the supraspinatus. No
full-thickness tear.
2. Suspected synovitis in the rotator interval. This can be
associated with adhesive capsulitis.
3. Moderate biceps tendinopathy.
4. Mild to moderate degenerative AC joint arthropathy, with anterior
downsloping of the acromion which can predispose to rotator cuff
impingement.
5. Moderate degenerative chondral thinning in the glenohumeral
joint. Degenerative findings in the labrum without a definite tear.

## 2018-03-12 DEATH — deceased

## 2019-03-13 DEATH — deceased
